# Patient Record
Sex: Female | Born: 1950 | Race: White | Hispanic: No | Marital: Married | Smoking: Never smoker
Health system: Southern US, Community
[De-identification: ages and names within clinical notes are randomized; demographics above are authoritative.]

## PROBLEM LIST (undated history)

## (undated) DIAGNOSIS — D369 Benign neoplasm, unspecified site: Secondary | ICD-10-CM

## (undated) DIAGNOSIS — H269 Unspecified cataract: Secondary | ICD-10-CM

## (undated) DIAGNOSIS — E785 Hyperlipidemia, unspecified: Secondary | ICD-10-CM

## (undated) DIAGNOSIS — K589 Irritable bowel syndrome without diarrhea: Secondary | ICD-10-CM

## (undated) DIAGNOSIS — T7840XA Allergy, unspecified, initial encounter: Secondary | ICD-10-CM

## (undated) DIAGNOSIS — F419 Anxiety disorder, unspecified: Secondary | ICD-10-CM

## (undated) DIAGNOSIS — R011 Cardiac murmur, unspecified: Secondary | ICD-10-CM

## (undated) DIAGNOSIS — K602 Anal fissure, unspecified: Principal | ICD-10-CM

## (undated) HISTORY — PX: BREAST BIOPSY: SHX20

## (undated) HISTORY — DX: Anal fissure, unspecified: K60.2

## (undated) HISTORY — DX: Cardiac murmur, unspecified: R01.1

## (undated) HISTORY — DX: Irritable bowel syndrome, unspecified: K58.9

## (undated) HISTORY — DX: Allergy, unspecified, initial encounter: T78.40XA

## (undated) HISTORY — DX: Unspecified cataract: H26.9

## (undated) HISTORY — PX: TUBAL LIGATION: SHX77

## (undated) HISTORY — PX: COLON SURGERY: SHX602

## (undated) HISTORY — DX: Anxiety disorder, unspecified: F41.9

## (undated) HISTORY — PX: COLONOSCOPY: SHX174

## (undated) HISTORY — DX: Hyperlipidemia, unspecified: E78.5

## (undated) HISTORY — DX: Benign neoplasm, unspecified site: D36.9

## (undated) HISTORY — PX: BREAST CYST ASPIRATION: SHX578

---

## 1998-09-10 ENCOUNTER — Other Ambulatory Visit: Admission: RE | Admit: 1998-09-10 | Discharge: 1998-09-10 | Payer: Self-pay | Admitting: Obstetrics & Gynecology

## 1999-10-21 ENCOUNTER — Other Ambulatory Visit: Admission: RE | Admit: 1999-10-21 | Discharge: 1999-10-21 | Payer: Self-pay | Admitting: Obstetrics & Gynecology

## 1999-10-27 ENCOUNTER — Other Ambulatory Visit: Admission: RE | Admit: 1999-10-27 | Discharge: 1999-10-27 | Payer: Self-pay | Admitting: Otolaryngology

## 1999-10-27 ENCOUNTER — Encounter (INDEPENDENT_AMBULATORY_CARE_PROVIDER_SITE_OTHER): Payer: Self-pay

## 2000-12-10 ENCOUNTER — Other Ambulatory Visit: Admission: RE | Admit: 2000-12-10 | Discharge: 2000-12-10 | Payer: Self-pay | Admitting: Obstetrics & Gynecology

## 2002-01-19 HISTORY — PX: BREAST BIOPSY: SHX20

## 2002-01-24 ENCOUNTER — Other Ambulatory Visit: Admission: RE | Admit: 2002-01-24 | Discharge: 2002-01-24 | Payer: Self-pay | Admitting: Obstetrics & Gynecology

## 2002-01-26 ENCOUNTER — Encounter: Payer: Self-pay | Admitting: Obstetrics & Gynecology

## 2002-01-26 ENCOUNTER — Encounter (INDEPENDENT_AMBULATORY_CARE_PROVIDER_SITE_OTHER): Payer: Self-pay | Admitting: Specialist

## 2002-01-26 ENCOUNTER — Encounter: Admission: RE | Admit: 2002-01-26 | Discharge: 2002-01-26 | Payer: Self-pay | Admitting: Obstetrics & Gynecology

## 2003-03-06 ENCOUNTER — Other Ambulatory Visit: Admission: RE | Admit: 2003-03-06 | Discharge: 2003-03-06 | Payer: Self-pay | Admitting: Obstetrics & Gynecology

## 2003-12-18 ENCOUNTER — Ambulatory Visit: Payer: Self-pay | Admitting: Internal Medicine

## 2003-12-27 ENCOUNTER — Ambulatory Visit: Payer: Self-pay | Admitting: Internal Medicine

## 2004-03-21 ENCOUNTER — Ambulatory Visit: Payer: Self-pay | Admitting: Internal Medicine

## 2004-04-09 ENCOUNTER — Ambulatory Visit: Payer: Self-pay | Admitting: Internal Medicine

## 2004-04-10 ENCOUNTER — Other Ambulatory Visit: Admission: RE | Admit: 2004-04-10 | Discharge: 2004-04-10 | Payer: Self-pay | Admitting: Obstetrics & Gynecology

## 2005-08-21 ENCOUNTER — Ambulatory Visit: Payer: Self-pay | Admitting: Internal Medicine

## 2005-09-01 ENCOUNTER — Ambulatory Visit: Payer: Self-pay | Admitting: Internal Medicine

## 2005-12-22 ENCOUNTER — Ambulatory Visit: Payer: Self-pay | Admitting: Internal Medicine

## 2005-12-22 LAB — CONVERTED CEMR LAB
ALT: 23 units/L (ref 0–40)
AST: 27 units/L (ref 0–37)
Albumin: 3.9 g/dL (ref 3.5–5.2)
Alkaline Phosphatase: 64 units/L (ref 39–117)
BUN: 10 mg/dL (ref 6–23)
Basophils Absolute: 0 10*3/uL (ref 0.0–0.1)
Basophils Relative: 0.5 % (ref 0.0–1.0)
CO2: 29 meq/L (ref 19–32)
Calcium: 9.1 mg/dL (ref 8.4–10.5)
Chloride: 105 meq/L (ref 96–112)
Chol/HDL Ratio, serum: 3.4
Cholesterol: 220 mg/dL (ref 0–200)
Creatinine, Ser: 0.9 mg/dL (ref 0.4–1.2)
Eosinophil percent: 2.6 % (ref 0.0–5.0)
GFR calc non Af Amer: 69 mL/min
Glomerular Filtration Rate, Af Am: 83 mL/min/{1.73_m2}
Glucose, Bld: 86 mg/dL (ref 70–99)
HCT: 36.6 % (ref 36.0–46.0)
HDL: 64.2 mg/dL (ref >39.0)
Hemoglobin: 12.7 g/dL (ref 12.0–15.0)
LDL DIRECT: 141.6 mg/dL
Lymphocytes Relative: 37.1 % (ref 12.0–46.0)
MCHC: 34.7 g/dL (ref 30.0–36.0)
MCV: 88.7 fL (ref 78.0–100.0)
Monocytes Absolute: 0.6 10*3/uL (ref 0.2–0.7)
Monocytes Relative: 16.9 % — ABNORMAL HIGH (ref 3.0–11.0)
Neutro Abs: 1.5 10*3/uL (ref 1.4–7.7)
Neutrophils Relative %: 42.9 % — ABNORMAL LOW (ref 43.0–77.0)
Platelets: 232 10*3/uL (ref 150–400)
Potassium: 3.8 meq/L (ref 3.5–5.1)
RBC: 4.13 M/uL (ref 3.87–5.11)
RDW: 12.5 % (ref 11.5–14.6)
Sodium: 142 meq/L (ref 135–145)
TSH: 1.66 microintl units/mL (ref 0.35–5.50)
Total Bilirubin: 0.8 mg/dL (ref 0.3–1.2)
Total Protein: 6.9 g/dL (ref 6.0–8.3)
Triglyceride fasting, serum: 77 mg/dL (ref 0–149)
VLDL: 15 mg/dL (ref 0–40)
WBC: 3.5 10*3/uL — ABNORMAL LOW (ref 4.5–10.5)

## 2005-12-29 ENCOUNTER — Ambulatory Visit: Payer: Self-pay | Admitting: Internal Medicine

## 2006-03-30 ENCOUNTER — Ambulatory Visit: Payer: Self-pay | Admitting: Internal Medicine

## 2006-03-30 LAB — CONVERTED CEMR LAB
Cholesterol: 211 mg/dL (ref 0–200)
Direct LDL: 121.3 mg/dL
HDL: 62 mg/dL (ref >39.0)
Total CHOL/HDL Ratio: 3.4
Triglycerides: 87 mg/dL (ref 0–149)
VLDL: 17 mg/dL (ref 0–40)

## 2006-04-06 ENCOUNTER — Ambulatory Visit: Payer: Self-pay | Admitting: Internal Medicine

## 2006-10-25 DIAGNOSIS — J309 Allergic rhinitis, unspecified: Secondary | ICD-10-CM | POA: Insufficient documentation

## 2007-03-22 ENCOUNTER — Telehealth: Payer: Self-pay | Admitting: *Deleted

## 2007-04-08 ENCOUNTER — Ambulatory Visit: Payer: Self-pay | Admitting: Internal Medicine

## 2007-04-08 LAB — CONVERTED CEMR LAB
ALT: 39 units/L — ABNORMAL HIGH (ref 0–35)
AST: 47 units/L — ABNORMAL HIGH (ref 0–37)
Albumin: 4 g/dL (ref 3.5–5.2)
Alkaline Phosphatase: 55 units/L (ref 39–117)
BUN: 8 mg/dL (ref 6–23)
Basophils Absolute: 0 10*3/uL (ref 0.0–0.1)
Basophils Relative: 0.5 % (ref 0.0–1.0)
Bilirubin Urine: NEGATIVE
Bilirubin, Direct: 0.2 mg/dL (ref 0.0–0.3)
CO2: 29 meq/L (ref 19–32)
Calcium: 9.7 mg/dL (ref 8.4–10.5)
Chloride: 105 meq/L (ref 96–112)
Cholesterol: 211 mg/dL (ref 0–200)
Creatinine, Ser: 0.9 mg/dL (ref 0.4–1.2)
Direct LDL: 118.6 mg/dL
Eosinophils Absolute: 0.1 10*3/uL (ref 0.0–0.6)
Eosinophils Relative: 2 % (ref 0.0–5.0)
GFR calc Af Amer: 83 mL/min
GFR calc non Af Amer: 69 mL/min
Glucose, Bld: 81 mg/dL (ref 70–99)
Glucose, Urine, Semiquant: NEGATIVE
HCT: 37.1 % (ref 36.0–46.0)
HDL: 69.9 mg/dL (ref >39.0)
Hemoglobin: 12.3 g/dL (ref 12.0–15.0)
Ketones, urine, test strip: NEGATIVE
Lymphocytes Relative: 21.1 % (ref 12.0–46.0)
MCHC: 33.3 g/dL (ref 30.0–36.0)
MCV: 91 fL (ref 78.0–100.0)
Monocytes Absolute: 0.7 10*3/uL (ref 0.2–0.7)
Monocytes Relative: 14.3 % — ABNORMAL HIGH (ref 3.0–11.0)
Neutro Abs: 3.1 10*3/uL (ref 1.4–7.7)
Neutrophils Relative %: 62.1 % (ref 43.0–77.0)
Nitrite: NEGATIVE
Platelets: 206 10*3/uL (ref 150–400)
Potassium: 4 meq/L (ref 3.5–5.1)
Protein, U semiquant: NEGATIVE
RBC: 4.07 M/uL (ref 3.87–5.11)
RDW: 12.9 % (ref 11.5–14.6)
Sodium: 140 meq/L (ref 135–145)
Specific Gravity, Urine: 1.015
TSH: 1.26 microintl units/mL (ref 0.35–5.50)
Total Bilirubin: 0.8 mg/dL (ref 0.3–1.2)
Total CHOL/HDL Ratio: 3
Total Protein: 6.7 g/dL (ref 6.0–8.3)
Triglycerides: 102 mg/dL (ref 0–149)
Urobilinogen, UA: 0.2
VLDL: 20 mg/dL (ref 0–40)
WBC Urine, dipstick: NEGATIVE
WBC: 5 10*3/uL (ref 4.5–10.5)
pH: 7.5

## 2007-04-15 ENCOUNTER — Ambulatory Visit: Payer: Self-pay | Admitting: Internal Medicine

## 2007-04-15 DIAGNOSIS — J209 Acute bronchitis, unspecified: Secondary | ICD-10-CM | POA: Insufficient documentation

## 2007-06-03 ENCOUNTER — Telehealth: Payer: Self-pay | Admitting: *Deleted

## 2007-06-03 ENCOUNTER — Ambulatory Visit: Payer: Self-pay | Admitting: Internal Medicine

## 2007-06-03 LAB — CONVERTED CEMR LAB
ALT: 20 units/L (ref 0–35)
AST: 26 units/L (ref 0–37)
Albumin: 3.9 g/dL (ref 3.5–5.2)
Alkaline Phosphatase: 61 units/L (ref 39–117)
Bilirubin, Direct: 0.1 mg/dL (ref 0.0–0.3)
Total Bilirubin: 0.7 mg/dL (ref 0.3–1.2)
Total Protein: 7 g/dL (ref 6.0–8.3)

## 2007-06-10 ENCOUNTER — Ambulatory Visit: Payer: Self-pay | Admitting: Internal Medicine

## 2007-08-12 ENCOUNTER — Ambulatory Visit: Payer: Self-pay | Admitting: Internal Medicine

## 2007-08-19 LAB — CONVERTED CEMR LAB: Pap Smear: NORMAL

## 2007-08-26 ENCOUNTER — Encounter: Payer: Self-pay | Admitting: Internal Medicine

## 2007-08-26 ENCOUNTER — Ambulatory Visit: Payer: Self-pay | Admitting: Internal Medicine

## 2007-08-26 DIAGNOSIS — D369 Benign neoplasm, unspecified site: Secondary | ICD-10-CM

## 2007-08-26 HISTORY — DX: Benign neoplasm, unspecified site: D36.9

## 2007-09-09 ENCOUNTER — Encounter: Payer: Self-pay | Admitting: Internal Medicine

## 2007-12-02 ENCOUNTER — Ambulatory Visit: Payer: Self-pay | Admitting: Internal Medicine

## 2007-12-02 LAB — CONVERTED CEMR LAB
ALT: 27 units/L (ref 0–35)
AST: 35 units/L (ref 0–37)
Albumin: 3.8 g/dL (ref 3.5–5.2)
Alkaline Phosphatase: 53 units/L (ref 39–117)
Bilirubin, Direct: 0.1 mg/dL (ref 0.0–0.3)
Cholesterol: 205 mg/dL (ref 0–200)
Direct LDL: 107.1 mg/dL
HDL: 66.6 mg/dL (ref >39.0)
Total Bilirubin: 0.6 mg/dL (ref 0.3–1.2)
Total CHOL/HDL Ratio: 3.1
Total Protein: 6.6 g/dL (ref 6.0–8.3)
Triglycerides: 74 mg/dL (ref 0–149)
VLDL: 15 mg/dL (ref 0–40)

## 2007-12-09 ENCOUNTER — Ambulatory Visit: Payer: Self-pay | Admitting: Internal Medicine

## 2007-12-09 DIAGNOSIS — E785 Hyperlipidemia, unspecified: Secondary | ICD-10-CM | POA: Insufficient documentation

## 2008-05-31 ENCOUNTER — Ambulatory Visit: Payer: Self-pay | Admitting: Internal Medicine

## 2008-05-31 LAB — CONVERTED CEMR LAB
ALT: 19 units/L (ref 0–35)
AST: 31 units/L (ref 0–37)
Albumin: 3.8 g/dL (ref 3.5–5.2)
Alkaline Phosphatase: 42 units/L (ref 39–117)
Bilirubin, Direct: 0.1 mg/dL (ref 0.0–0.3)
Cholesterol: 225 mg/dL — ABNORMAL HIGH (ref 0–200)
Direct LDL: 139 mg/dL
HDL: 75.5 mg/dL (ref >39.00)
Total Bilirubin: 0.8 mg/dL (ref 0.3–1.2)
Total CHOL/HDL Ratio: 3
Total Protein: 7 g/dL (ref 6.0–8.3)
Triglycerides: 60 mg/dL (ref 0.0–149.0)
VLDL: 12 mg/dL (ref 0.0–40.0)

## 2008-06-07 ENCOUNTER — Ambulatory Visit: Payer: Self-pay | Admitting: Internal Medicine

## 2008-06-07 LAB — CONVERTED CEMR LAB
Basophils Relative: 0.2 % (ref 0.0–3.0)
CO2: 27 meq/L (ref 19–32)
Chloride: 105 meq/L (ref 96–112)
Creatinine, Ser: 0.9 mg/dL (ref 0.4–1.2)
Eosinophils Absolute: 0.1 10*3/uL (ref 0.0–0.7)
Eosinophils Relative: 1.7 % (ref 0.0–5.0)
HCT: 39.1 % (ref 36.0–46.0)
Hemoglobin: 13.4 g/dL (ref 12.0–15.0)
Lymphs Abs: 1.2 10*3/uL (ref 0.7–4.0)
MCHC: 34.2 g/dL (ref 30.0–36.0)
MCV: 89.7 fL (ref 78.0–100.0)
Monocytes Absolute: 0.4 10*3/uL (ref 0.1–1.0)
Neutro Abs: 1.6 10*3/uL (ref 1.4–7.7)
Potassium: 3.7 meq/L (ref 3.5–5.1)
RBC: 4.35 M/uL (ref 3.87–5.11)
Sodium: 138 meq/L (ref 135–145)
WBC: 3.3 10*3/uL — ABNORMAL LOW (ref 4.5–10.5)

## 2008-07-18 LAB — CONVERTED CEMR LAB: Pap Smear: NORMAL

## 2008-08-23 ENCOUNTER — Ambulatory Visit: Payer: Self-pay | Admitting: Family Medicine

## 2008-12-04 ENCOUNTER — Telehealth: Payer: Self-pay | Admitting: Internal Medicine

## 2008-12-04 ENCOUNTER — Encounter: Payer: Self-pay | Admitting: Internal Medicine

## 2008-12-04 ENCOUNTER — Ambulatory Visit: Payer: Self-pay | Admitting: Internal Medicine

## 2008-12-04 LAB — CONVERTED CEMR LAB
AST: 31 units/L (ref 0–37)
Alkaline Phosphatase: 48 units/L (ref 39–117)
Total Bilirubin: 0.8 mg/dL (ref 0.3–1.2)
Total CHOL/HDL Ratio: 3
VLDL: 12.6 mg/dL (ref 0.0–40.0)

## 2008-12-11 ENCOUNTER — Ambulatory Visit: Payer: Self-pay | Admitting: Internal Medicine

## 2009-06-11 ENCOUNTER — Ambulatory Visit: Payer: Self-pay | Admitting: Internal Medicine

## 2009-06-11 LAB — CONVERTED CEMR LAB
AST: 27 units/L (ref 0–37)
Albumin: 4.2 g/dL (ref 3.5–5.2)
Alkaline Phosphatase: 49 units/L (ref 39–117)
Basophils Absolute: 0 10*3/uL (ref 0.0–0.1)
Bilirubin Urine: NEGATIVE
Bilirubin, Direct: 0.1 mg/dL (ref 0.0–0.3)
CO2: 31 meq/L (ref 19–32)
Calcium: 9.3 mg/dL (ref 8.4–10.5)
Cholesterol: 235 mg/dL — ABNORMAL HIGH (ref 0–200)
Creatinine, Ser: 0.8 mg/dL (ref 0.4–1.2)
Eosinophils Absolute: 0.1 10*3/uL (ref 0.0–0.7)
GFR calc non Af Amer: 77.9 mL/min (ref >60)
Glucose, Bld: 83 mg/dL (ref 70–99)
HDL: 82.1 mg/dL (ref >39.00)
Hemoglobin: 12.5 g/dL (ref 12.0–15.0)
Ketones, urine, test strip: NEGATIVE
Lymphocytes Relative: 33.8 % (ref 12.0–46.0)
MCHC: 34.6 g/dL (ref 30.0–36.0)
Monocytes Relative: 11.4 % (ref 3.0–12.0)
Neutrophils Relative %: 52.6 % (ref 43.0–77.0)
Nitrite: NEGATIVE
Platelets: 218 10*3/uL (ref 150.0–400.0)
RDW: 13.7 % (ref 11.5–14.6)
Sodium: 142 meq/L (ref 135–145)
Specific Gravity, Urine: 1.015
Total Bilirubin: 0.7 mg/dL (ref 0.3–1.2)
Triglycerides: 57 mg/dL (ref 0.0–149.0)
Urobilinogen, UA: 0.2

## 2009-06-24 ENCOUNTER — Ambulatory Visit: Payer: Self-pay | Admitting: Internal Medicine

## 2009-07-18 LAB — HM PAP SMEAR

## 2009-07-18 LAB — HM MAMMOGRAPHY

## 2009-12-18 ENCOUNTER — Ambulatory Visit: Payer: Self-pay | Admitting: Internal Medicine

## 2009-12-18 LAB — CONVERTED CEMR LAB
Albumin: 4.1 g/dL (ref 3.5–5.2)
Alkaline Phosphatase: 54 units/L (ref 39–117)
Cholesterol: 261 mg/dL — ABNORMAL HIGH (ref 0–200)
Direct LDL: 162.7 mg/dL
HDL: 76.8 mg/dL (ref >39.00)
Total CHOL/HDL Ratio: 3
Total Protein: 7 g/dL (ref 6.0–8.3)
Triglycerides: 65 mg/dL (ref 0.0–149.0)
VLDL: 13 mg/dL (ref 0.0–40.0)

## 2009-12-25 ENCOUNTER — Ambulatory Visit: Payer: Self-pay | Admitting: Internal Medicine

## 2009-12-25 LAB — CONVERTED CEMR LAB: HDL goal, serum: 40 mg/dL

## 2009-12-26 ENCOUNTER — Encounter: Payer: Self-pay | Admitting: Internal Medicine

## 2010-02-17 NOTE — Assessment & Plan Note (Addendum)
Summary: cpx---no pap//ccm   Vital Signs:  Patient profile:   60 year old female Height:      65 inches Weight:      152 pounds BMI:     25.39 Temp:     98.2 degrees F oral Pulse rate:   60 / minute Resp:     12 per minute BP sitting:   110 / 80  (left arm)  Vitals Entered By: Willy Eddy, LPN (June 25, 2839 8:45 AM) CC: cpx-   CC:  cpx-.  Preventive Screening-Counseling & Management  Alcohol-Tobacco     Smoking Status: never  Problems Prior to Update: 1)  Hyperlipidemia, Borderline  (ICD-272.4) 2)  Acute Bronchitis  (ICD-466.0) 3)  Physical Examination  (ICD-V70.0) 4)  Family History Diabetes 1st Degree Relative  (ICD-V18.0) 5)  Allergic Rhinitis  (ICD-477.9)  Current Problems (verified): 1)  Hyperlipidemia, Borderline  (ICD-272.4) 2)  Acute Bronchitis  (ICD-466.0) 3)  Physical Examination  (ICD-V70.0) 4)  Family History Diabetes 1st Degree Relative  (ICD-V18.0) 5)  Allergic Rhinitis  (ICD-477.9)  Medications Prior to Update: 1)  Zyrtec Allergy 10 Mg  Tabs (Cetirizine Hcl) .... One By Mouth Daily 2)  Calcium 600 + D 600-200 Mg-Unit  Tabs (Calcium Carbonate-Vitamin D) .Marland Kitchen.. 1200 -1600 Once Daily 3)  Red Yeast Rice 600 Mg  Caps (Red Yeast Rice Extract) .... 2 Two Times A Day 4)  Flax Seed Oil 1000 Mg  Caps (Flaxseed (Linseed)) .... Once Daily 5)  Nasonex 50 Mcg/act Susp (Mometasone Furoate) .... Two Spray in Each Nostril As Needed 6)  Amoxicillin 500 Mg Caps (Amoxicillin) .... One By Mouth Tid 7)  Allerx Dose Pack  Misc (Pse-Methscop & Cpm-Pe-Methscop) .... One By Mouth Two Times A Day As Directed 8)  Aspirin 81 Mg Tbec (Aspirin) .Marland Kitchen.. 1 Once Daily 9)  Vitamin D 1000 Unit Tabs (Cholecalciferol) .Marland Kitchen.. 1 Once Daily  Current Medications (verified): 1)  Zyrtec Allergy 10 Mg  Tabs (Cetirizine Hcl) .... One By Mouth Daily 2)  Calcium 600 + D 600-200 Mg-Unit  Tabs (Calcium Carbonate-Vitamin D) .Marland Kitchen.. 1200 -1600 Once Daily 3)  Red Yeast Rice 600 Mg  Caps (Red Yeast Rice  Extract) .... 2  Once Daily 4)  Flax Seed Oil 1000 Mg  Caps (Flaxseed (Linseed)) .... Once Daily 5)  Nasonex 50 Mcg/act Susp (Mometasone Furoate) .... Two Spray in Each Nostril As Needed 6)  Aspirin 81 Mg Tbec (Aspirin) .Marland Kitchen.. 1 Once Daily 7)  Vitamin D 1000 Unit Tabs (Cholecalciferol) .Marland Kitchen.. 1 Once Daily  Allergies (verified): No Known Drug Allergies  Past History:  Family History: Last updated: 10/25/2006 Family History Diabetes 1st degree relative Family History Hypertension Family History of Cardiovascular disorder Family History of Leukemia  Social History: Last updated: 10/25/2006 Occupation: Married Never Smoked Alcohol use-no  Risk Factors: Smoking Status: never (06/24/2009)  Past medical, surgical, family and social histories (including risk factors) reviewed, and no changes noted (except as noted below).  Past Medical History: Reviewed history from 10/25/2006 and no changes required. Allergies Carpal Tunnel Allergic rhinitis  Past Surgical History: Reviewed history from 10/25/2006 and no changes required. Tubal ligation Breast Bx  Family History: Reviewed history from 10/25/2006 and no changes required. Family History Diabetes 1st degree relative Family History Hypertension Family History of Cardiovascular disorder Family History of Leukemia  Social History: Reviewed history from 10/25/2006 and no changes required. Occupation: Married Never Smoked Alcohol use-no  Review of Systems  The patient denies anorexia, fever, weight loss, weight  gain, vision loss, decreased hearing, hoarseness, chest pain, syncope, dyspnea on exertion, peripheral edema, prolonged cough, headaches, hemoptysis, abdominal pain, melena, hematochezia, severe indigestion/heartburn, hematuria, incontinence, genital sores, muscle weakness, suspicious skin lesions, transient blindness, difficulty walking, depression, unusual weight change, abnormal bleeding, enlarged lymph nodes,  angioedema, and breast masses.    Physical Exam  General:  Well-developed,well-nourished,in no acute distress; alert,appropriate and cooperative throughout examination Head:  Normocephalic and atraumatic without obvious abnormalities. No apparent alopecia or balding. Eyes:  No corneal or conjunctival inflammation noted. EOMI. Perrla. Funduscopic exam benign, without hemorrhages, exudates or papilledema. Vision grossly normal. Ears:  R ear normal and L ear normal.   Nose:  no purulent discharge noted Mouth:  pharyngeal erythema and postnasal drip.   Neck:  No deformities, masses, or tenderness noted. Lungs:  Normal respiratory effort, chest expands symmetrically. Lungs are clear to auscultation, no crackles or wheezes. Heart:  regular rhythm and rate Abdomen:  soft, non-tender, and normal bowel sounds.   Pulses:  R and L carotid,radial,femoral,dorsalis pedis and posterior tibial pulses are full and equal bilaterally Extremities:  No clubbing, cyanosis, edema, or deformity noted with normal full range of motion of all joints.   Neurologic:  No cranial nerve deficits noted. Station and gait are normal. Plantar reflexes are down-going bilaterally. DTRs are symmetrical throughout. Sensory, motor and coordinative functions appear intact.   Impression & Recommendations:  Problem # 1:  PHYSICAL EXAMINATION (ICD-V70.0) .cpx Mammogram: normal (07/18/2008) Pap smear: normal (07/18/2008) Colonoscopy: Location:  [facility].   (08/26/2007) Td Booster: Historical (01/19/2005)   Flu Vax: Historical (10/19/2008)   Chol: 235 (06/11/2009)   HDL: 82.10 (06/11/2009)   LDL: DEL (12/02/2007)   TG: 57.0 (06/11/2009) TSH: 2.66 (06/11/2009)   Next mammogram due:: 07/2009 (06/24/2009) Next Colonoscopy due:: 08/2012 (08/26/2007)  Discussed using sunscreen, use of alcohol, drug use, self breast exam, routine dental care, routine eye care, schedule for GYN exam, routine physical exam, seat belts,  multiple vitamins, osteoporosis prevention, adequate calcium intake in diet, recommendations for immunizations, mammograms and Pap smears.  Discussed exercise and checking cholesterol.  Discussed gun safety, safe sex, and contraception.  Problem # 2:  ALLERGIC RHINITIS (ICD-477.9) Assessment: Unchanged  Her updated medication list for this problem includes:    Zyrtec Allergy 10 Mg Tabs (Cetirizine hcl) ..... One by mouth daily    Nasonex 50 Mcg/act Susp (Mometasone furoate) .Marland Kitchen..Marland Kitchen Two spray in each nostril as needed  Discussed use of allergy medications and environmental measures.   Complete Medication List: 1)  Zyrtec Allergy 10 Mg Tabs (Cetirizine hcl) .... One by mouth daily 2)  Calcium 600 + D 600-200 Mg-unit Tabs (Calcium carbonate-vitamin d) .Marland Kitchen.. 1200 -1600 once daily 3)  Red Yeast Rice 600 Mg Caps (Red yeast rice extract) .... 2  once daily 4)  Flax Seed Oil 1000 Mg Caps (Flaxseed (linseed)) .... Once daily 5)  Nasonex 50 Mcg/act Susp (Mometasone furoate) .... Two spray in each nostril as needed 6)  Aspirin 81 Mg Tbec (Aspirin) .Marland Kitchen.. 1 once daily 7)  Vitamin D 1000 Unit Tabs (Cholecalciferol) .Marland Kitchen.. 1 once daily 8)  Allerx-d 120-2.5 Mg Xr12h-tab (Pseudoephedrine-methscopolamin) .... One by mouth two times a day for 7 days  Patient Instructions: 1)  Please schedule a follow-up appointment in 6 months. 2)  Hepatic Panel prior to visit, ICD-9:995.20 272.2 3)  Lipid Panel prior to visit, ICD-9: Prescriptions: ZYRTEC ALLERGY 10 MG  TABS (CETIRIZINE HCL) one by mouth daily  #90 x 3   Entered by:  Willy Eddy, LPN   Authorized by:   Stacie Glaze MD   Signed by:   Willy Eddy, LPN on 16/10/9602   Method used:   Electronically to        [facility]* (mail-order)             ,          Ph: [phone]       Fax: [phone]   RxID:   [id number]    Preventive Care Screening  Mammogram:    Date:  07/18/2008    Next Due:  07/2009    Results:  normal   Pap  Smear:    Date:  07/18/2008    Next Due:  07/2009    Results:  normal     Immunization History:  Influenza Immunization History:    Influenza:  historical (10/19/2008)

## 2010-02-17 NOTE — Letter (Addendum)
Summary: Aeronautical engineer   Imported By: Georgian Co 12/26/2009 09:26:09  _____________________________________________________________________  External Attachment:    Type:   Image     Comment:   External Document

## 2010-02-20 NOTE — Assessment & Plan Note (Signed)
Summary: 6 month fup//ccm   Vital Signs:  Patient profile:   60 year old female Height:      65 inches Weight:      154 pounds BMI:     25.72 Temp:     98.2 degrees F oral Pulse rate:   64 / minute Resp:     14 per minute BP sitting:   130 / 80  (left arm)  Vitals Entered By: Willy Eddy, LPN (December 25, 2009 9:23 AM) CC: roa, Lipid Management Is Patient Diabetic? No   Primary Care Starr Engel:  Stacie Glaze MD  CC:  roa and Lipid Management.  History of Present Illness: follow up for lipids  Follow-Up Visit      This is a 60 year old woman who presents for Follow-up visit.  The patient denies chest pain, palpitations, dizziness, syncope, low blood sugar symptoms, high blood sugar symptoms, edema, SOB, DOE, PND, and orthopnea.  Since the last visit the patient notes no new problems or concerns.  The patient reports taking meds as prescribed.  When questioned about possible medication side effects, the patient notes cramping.    Lipid Management History:      Positive NCEP/ATP III risk factors include female age 61 years old or older.  Negative NCEP/ATP III risk factors include HDL cholesterol greater than 60, non-tobacco-user status, non-hypertensive, no ASHD (atherosclerotic heart disease), no prior stroke/TIA, no peripheral vascular disease, and no history of aortic aneurysm.     Preventive Screening-Counseling & Management  Alcohol-Tobacco     Smoking Status: never     Tobacco Counseling: not indicated; no tobacco use  Problems Prior to Update: 1)  Hyperlipidemia, Borderline  (ICD-272.4) 2)  Acute Bronchitis  (ICD-466.0) 3)  Physical Examination  (ICD-V70.0) 4)  Family History Diabetes 1st Degree Relative  (ICD-V18.0) 5)  Allergic Rhinitis  (ICD-477.9)  Current Problems (verified): 1)  Hyperlipidemia, Borderline  (ICD-272.4) 2)  Acute Bronchitis  (ICD-466.0) 3)  Physical Examination  (ICD-V70.0) 4)  Family History Diabetes 1st Degree Relative   (ICD-V18.0) 5)  Allergic Rhinitis  (ICD-477.9)  Medications Prior to Update: 1)  Zyrtec Allergy 10 Mg  Tabs (Cetirizine Hcl) .... One By Mouth Daily 2)  Calcium 600 + D 600-200 Mg-Unit  Tabs (Calcium Carbonate-Vitamin D) .Marland Kitchen.. 1200 -1600 Once Daily 3)  Red Yeast Rice 600 Mg  Caps (Red Yeast Rice Extract) .... 2  Once Daily 4)  Flax Seed Oil 1000 Mg  Caps (Flaxseed (Linseed)) .... Once Daily 5)  Nasonex 50 Mcg/act Susp (Mometasone Furoate) .... Two Spray in Each Nostril As Needed 6)  Aspirin 81 Mg Tbec (Aspirin) .Marland Kitchen.. 1 Once Daily 7)  Vitamin D 1000 Unit Tabs (Cholecalciferol) .Marland Kitchen.. 1 Once Daily  Current Medications (verified): 1)  Zyrtec Allergy 10 Mg  Tabs (Cetirizine Hcl) .... One By Mouth Daily 2)  Calcium 600 + D 600-200 Mg-Unit  Tabs (Calcium Carbonate-Vitamin D) .Marland Kitchen.. 1200 -1600 Once Daily 3)  Crestor 10 Mg Tabs (Rosuvastatin Calcium) .... One By Mouth Every Friday Night 4)  Flax Seed Oil 1000 Mg  Caps (Flaxseed (Linseed)) .... Once Daily 5)  Nasonex 50 Mcg/act Susp (Mometasone Furoate) .... Two Spray in Each Nostril As Needed 6)  Aspirin 81 Mg Tbec (Aspirin) .Marland Kitchen.. 1 Once Daily 7)  Vitamin D 1000 Unit Tabs (Cholecalciferol) .Marland Kitchen.. 1 Once Daily  Allergies (verified): No Known Drug Allergies  Past History:  Family History: Last updated: 10/25/2006 Family History Diabetes 1st degree relative Family History Hypertension Family  History of Cardiovascular disorder Family History of Leukemia  Social History: Last updated: 10/25/2006 Occupation: Married Never Smoked Alcohol use-no  Risk Factors: Smoking Status: never (12/25/2009)  Past medical, surgical, family and social histories (including risk factors) reviewed, and no changes noted (except as noted below).  Past Medical History: Reviewed history from 10/25/2006 and no changes required. Allergies Carpal Tunnel Allergic rhinitis  Past Surgical History: Reviewed history from 10/25/2006 and no changes required. Tubal  ligation Breast Bx  Family History: Reviewed history from 10/25/2006 and no changes required. Family History Diabetes 1st degree relative Family History Hypertension Family History of Cardiovascular disorder Family History of Leukemia  Social History: Reviewed history from 10/25/2006 and no changes required. Occupation: Married Never Smoked Alcohol use-no  Review of Systems  The patient denies anorexia, fever, weight loss, weight gain, vision loss, decreased hearing, hoarseness, chest pain, syncope, dyspnea on exertion, peripheral edema, prolonged cough, headaches, hemoptysis, abdominal pain, melena, hematochezia, severe indigestion/heartburn, hematuria, incontinence, genital sores, muscle weakness, suspicious skin lesions, transient blindness, difficulty walking, depression, unusual weight change, abnormal bleeding, enlarged lymph nodes, angioedema, and breast masses.    Physical Exam  General:  Well-developed,well-nourished,in no acute distress; alert,appropriate and cooperative throughout examination Head:  Normocephalic and atraumatic without obvious abnormalities. No apparent alopecia or balding. Eyes:  No corneal or conjunctival inflammation noted. EOMI. Perrla. Funduscopic exam benign, without hemorrhages, exudates or papilledema. Vision grossly normal. Ears:  R ear normal and L ear normal.   Neck:  No deformities, masses, or tenderness noted. Lungs:  Normal respiratory effort, chest expands symmetrically. Lungs are clear to auscultation, no crackles or wheezes. Heart:  regular rhythm and rate Abdomen:  soft, non-tender, and normal bowel sounds.   Neurologic:  No cranial nerve deficits noted. Station and gait are normal. Plantar reflexes are down-going bilaterally. DTRs are symmetrical throughout. Sensory, motor and coordinative functions appear intact.   Impression & Recommendations:  Problem # 1:  HYPERLIPIDEMIA, BORDERLINE (ICD-272.4) Assessment Improved  Labs  Reviewed: SGOT: 29 (12/18/2009)   SGPT: 21 (12/18/2009)  Lipid Goals: Chol Goal: 200 (12/25/2009)   HDL Goal: 40 (12/25/2009)   LDL Goal: 160 (12/25/2009)   TG Goal: 150 (12/25/2009)  10 Yr Risk Heart Disease: Not enough information   HDL:76.80 (12/18/2009), 82.10 (06/11/2009)  LDL:DEL (12/02/2007), DEL (04/08/2007)  Chol:261 (12/18/2009), 235 (06/11/2009)  Trig:65.0 (12/18/2009), 57.0 (06/11/2009)  Her updated medication list for this problem includes:    Crestor 10 Mg Tabs (Rosuvastatin calcium) ..... One by mouth every friday night  Problem # 2:  ALLERGIC RHINITIS (ICD-477.9) Assessment: Deteriorated  Her updated medication list for this problem includes:    Zyrtec Allergy 10 Mg Tabs (Cetirizine hcl) ..... One by mouth daily    Nasonex 50 Mcg/act Susp (Mometasone furoate) .Marland Kitchen..Marland Kitchen Two spray in each nostril as needed  Discussed use of allergy medications and environmental measures.   Complete Medication List: 1)  Zyrtec Allergy 10 Mg Tabs (Cetirizine hcl) .... One by mouth daily 2)  Calcium 600 + D 600-200 Mg-unit Tabs (Calcium carbonate-vitamin d) .Marland Kitchen.. 1200 -1600 once daily 3)  Crestor 10 Mg Tabs (Rosuvastatin calcium) .... One by mouth every friday night 4)  Flax Seed Oil 1000 Mg Caps (Flaxseed (linseed)) .... Once daily 5)  Nasonex 50 Mcg/act Susp (Mometasone furoate) .... Two spray in each nostril as needed 6)  Aspirin 81 Mg Tbec (Aspirin) .Marland Kitchen.. 1 once daily 7)  Vitamin D 1000 Unit Tabs (Cholecalciferol) .Marland Kitchen.. 1 once daily  Other Orders: Zoster (Shingles) Vaccine Live [phone]) Admin  1st Vaccine (16109)  Lipid Assessment/Plan:      Based on NCEP/ATP III, the patient's risk factor category is "0-1 risk factors".  The patient's lipid goals are as follows: Total cholesterol goal is 200; LDL cholesterol goal is 160; HDL cholesterol goal is 40; Triglyceride goal is 150.  Her LDL cholesterol goal has not been met.  Secondary causes for hyperlipidemia have been ruled out.  She has been  counseled on adjunctive measures for lowering her cholesterol and has been provided with dietary instructions.    Patient Instructions: 1)  Please schedule a follow-up appointment in 4 months. 2)  Hepatic Panel prior to visit, ICD-9:995.20 3)  Lipid Panel prior to visit, ICD-9:272.4   Orders Added: 1)  Zoster (Shingles) Vaccine Live [90736] 2)  Admin 1st Vaccine [90471] 3)  Est. Patient Level IV [60454]   Immunization History:  Influenza Immunization History:    Influenza:  historical (10/25/2009)  Immunizations Administered:  Zostavax # 1:    Vaccine Type: Zostavax    Site: left deltoid    Mfr: Merck    Dose: 0.5 ml    Route: [place]    Given by: Willy Eddy, LPN    Exp. Date: 09/06/2010    Lot #: [id number]    VIS given: 10/31/04 given December 25, 2009.  Rotavirus (to be given today)   Immunization History:  Influenza Immunization History:    Influenza:  Historical (10/25/2009)  Immunizations Administered:  Zostavax # 1:    Vaccine Type: Zostavax    Site: left deltoid    Mfr: Merck    Dose: 0.5 ml    Route:     Given by: Willy Eddy, LPN    Exp. Date: 09/06/2010    Lot #: [id number]    VIS given: 10/31/04 given December 25, 2009.

## 2010-04-16 ENCOUNTER — Other Ambulatory Visit: Payer: Self-pay

## 2010-04-23 ENCOUNTER — Ambulatory Visit: Payer: Self-pay | Admitting: Internal Medicine

## 2010-07-02 ENCOUNTER — Other Ambulatory Visit (INDEPENDENT_AMBULATORY_CARE_PROVIDER_SITE_OTHER): Payer: 59

## 2010-07-02 ENCOUNTER — Encounter: Payer: Self-pay | Admitting: Internal Medicine

## 2010-07-02 DIAGNOSIS — Z0000 Encounter for general adult medical examination without abnormal findings: Secondary | ICD-10-CM

## 2010-07-02 LAB — POCT URINALYSIS DIPSTICK
Bilirubin, UA: NEGATIVE
Ketones, UA: NEGATIVE
Nitrite, UA: NEGATIVE
Spec Grav, UA: 1.025
pH, UA: 5

## 2010-07-02 LAB — HEPATIC FUNCTION PANEL
ALT: 17 U/L (ref 0–35)
Total Bilirubin: 0.6 mg/dL (ref 0.3–1.2)

## 2010-07-02 LAB — CBC WITH DIFFERENTIAL/PLATELET
Basophils Absolute: 0 10*3/uL (ref 0.0–0.1)
Basophils Relative: 0.5 % (ref 0.0–3.0)
Eosinophils Absolute: 0 10*3/uL (ref 0.0–0.7)
Hemoglobin: 12.2 g/dL (ref 12.0–15.0)
Lymphocytes Relative: 39.3 % (ref 12.0–46.0)
MCHC: 35.1 g/dL (ref 30.0–36.0)
MCV: 89.7 fl (ref 78.0–100.0)
Monocytes Absolute: 0.4 10*3/uL (ref 0.1–1.0)
Neutro Abs: 1.6 10*3/uL (ref 1.4–7.7)
RBC: 3.88 Mil/uL (ref 3.87–5.11)
RDW: 13.9 % (ref 11.5–14.6)

## 2010-07-02 LAB — BASIC METABOLIC PANEL
CO2: 28 mEq/L (ref 19–32)
Calcium: 9 mg/dL (ref 8.4–10.5)
Chloride: 108 mEq/L (ref 96–112)
Creatinine, Ser: 0.8 mg/dL (ref 0.4–1.2)
Glucose, Bld: 82 mg/dL (ref 70–99)
Sodium: 142 mEq/L (ref 135–145)

## 2010-07-02 LAB — LIPID PANEL
Cholesterol: 249 mg/dL — ABNORMAL HIGH (ref 0–200)
Total CHOL/HDL Ratio: 3
Triglycerides: 60 mg/dL (ref 0.0–149.0)

## 2010-07-09 ENCOUNTER — Ambulatory Visit (INDEPENDENT_AMBULATORY_CARE_PROVIDER_SITE_OTHER): Payer: 59 | Admitting: Internal Medicine

## 2010-07-09 ENCOUNTER — Encounter: Payer: Self-pay | Admitting: Internal Medicine

## 2010-07-09 DIAGNOSIS — Z0000 Encounter for general adult medical examination without abnormal findings: Secondary | ICD-10-CM

## 2010-07-09 DIAGNOSIS — E785 Hyperlipidemia, unspecified: Secondary | ICD-10-CM

## 2010-07-09 DIAGNOSIS — L570 Actinic keratosis: Secondary | ICD-10-CM

## 2010-07-09 DIAGNOSIS — Z792 Long term (current) use of antibiotics: Secondary | ICD-10-CM

## 2010-07-09 NOTE — Progress Notes (Signed)
  Subjective:    Patient ID: Angela Robbins, female    DOB: 10-12-1950, 60 y.o.   MRN: [id number]  HPI Patient presents for complete physical examination.  She has a history of hyperlipidemia and allergic rhinitis.  She was taking red rice yeast twice daily but had suspended the G-tube some muscle aches she then began coenzyme Q 10 and has resumed the red rice use but only a few days prior to this visit.  On her screening blood work her LDL cholesterol is increased  total has increased and her risk factors warned that she have an LDL cholesterol less than 409.    Review of Systems  Constitutional: Negative for activity change, appetite change and fatigue.  HENT: Negative for ear pain, congestion, neck pain, postnasal drip and sinus pressure.   Eyes: Negative for redness and visual disturbance.  Respiratory: Negative for cough, shortness of breath and wheezing.   Gastrointestinal: Negative for abdominal pain and abdominal distention.  Genitourinary: Negative for dysuria, frequency and menstrual problem.  Musculoskeletal: Negative for myalgias, joint swelling and arthralgias.  Skin: Negative for rash and wound.  Neurological: Negative for dizziness, weakness and headaches.  Hematological: Negative for adenopathy. Does not bruise/bleed easily.  Psychiatric/Behavioral: Negative for sleep disturbance and decreased concentration.       Objective:   Physical Exam  Constitutional: She is oriented to person, place, and time. She appears well-developed and well-nourished. No distress.  HENT:  Head: Normocephalic and atraumatic.  Right Ear: External ear normal.  Left Ear: External ear normal.  Nose: Nose normal.  Mouth/Throat: Oropharynx is clear and moist.  Eyes: Conjunctivae and EOM are normal. Pupils are equal, round, and reactive to light.  Neck: Normal range of motion. Neck supple. No JVD present. No tracheal deviation present. No thyromegaly present.  Cardiovascular: Normal rate,  regular rhythm, normal heart sounds and intact distal pulses.   No murmur heard. Pulmonary/Chest: Effort normal and breath sounds normal. She has no wheezes. She exhibits no tenderness.  Abdominal: Soft. Bowel sounds are normal.  Musculoskeletal: Normal range of motion. She exhibits no edema and no tenderness.  Lymphadenopathy:    She has no cervical adenopathy.  Neurological: She is alert and oriented to person, place, and time. She has normal reflexes. No cranial nerve deficit.  Skin: Skin is warm and dry. She is not diaphoretic.  Psychiatric: She has a normal mood and affect. Her behavior is normal.   On her face there is an actinic keratoses on the right cheek       Assessment & Plan:   Informed consent was obtained and the lesion was treated for 60 seconds of liquid nitrogen application the patient tolerated the procedure well as procedural care was discussed with the patient and instructions should the lesion reappears contact our office immediately   This is a routine physical examination for this healthy  Female. Reviewed all health maintenance protocols including mammography colonoscopy bone density and reviewed appropriate screening labs. Her immunization history was reviewed as well as her current medications and allergies refills of her chronic medications were given and the plan for yearly health maintenance was discussed all orders and referrals were made as appropriate.   For cholesterol she is urged to take her red rice yeast twice daily along with omega-3's thousand milligrams 4 times a day on it we will monitor her cholesterol in 3 months

## 2010-09-01 ENCOUNTER — Ambulatory Visit (INDEPENDENT_AMBULATORY_CARE_PROVIDER_SITE_OTHER): Payer: 59 | Admitting: Internal Medicine

## 2010-09-01 ENCOUNTER — Encounter: Payer: Self-pay | Admitting: Internal Medicine

## 2010-09-01 VITALS — BP 136/80 | HR 72 | Temp 98.2°F | Resp 16 | Ht 65.0 in | Wt 148.0 lb

## 2010-09-01 DIAGNOSIS — J309 Allergic rhinitis, unspecified: Secondary | ICD-10-CM

## 2010-09-01 DIAGNOSIS — J329 Chronic sinusitis, unspecified: Secondary | ICD-10-CM

## 2010-09-01 DIAGNOSIS — F419 Anxiety disorder, unspecified: Secondary | ICD-10-CM

## 2010-09-01 DIAGNOSIS — F411 Generalized anxiety disorder: Secondary | ICD-10-CM

## 2010-09-01 MED ORDER — DIAZEPAM 2 MG PO TABS: 2.0000 mg | ORAL_TABLET | Freq: Three times a day (TID) | ORAL | Status: DC | PRN

## 2010-09-01 NOTE — Progress Notes (Signed)
  Subjective:    Patient ID: Angela Robbins, female    DOB: 12-Aug-1950, 60 y.o.   MRN: [id number]  URI  Associated symptoms include coughing, ear pain and rhinorrhea. Pertinent negatives include no abdominal pain, congestion, dysuria, headaches, neck pain, rash or wheezing.  Cough Associated symptoms include ear pain, postnasal drip and rhinorrhea. Pertinent negatives include no eye redness, headaches, myalgias, rash, shortness of breath or wheezing.    URI, treated  At an urgent care she was treated with prednisone and b12 and lincosyn? After the prednisone she noted increased paranoid ideation and panic. She was given Omnicef 300 BID for 7 days. She still feels ill. Increased anxiety. No significant cough.  She has significant upper conjestion.   Review of Systems  Constitutional: Negative for activity change, appetite change and fatigue.  HENT: Positive for ear pain, rhinorrhea and postnasal drip. Negative for congestion, neck pain and sinus pressure.   Eyes: Negative for redness and visual disturbance.  Respiratory: Positive for cough. Negative for shortness of breath and wheezing.   Gastrointestinal: Negative for abdominal pain and abdominal distention.  Genitourinary: Negative for dysuria, frequency and menstrual problem.  Musculoskeletal: Negative for myalgias, joint swelling and arthralgias.  Skin: Negative for rash and wound.  Neurological: Negative for dizziness, weakness and headaches.  Hematological: Negative for adenopathy. Does not bruise/bleed easily.  Psychiatric/Behavioral: Positive for behavioral problems, dysphoric mood and agitation. Negative for sleep disturbance and decreased concentration. The patient is nervous/anxious.         Objective:   Physical Exam  Constitutional: She is oriented to person, place, and time. She appears well-developed and well-nourished. No distress.  HENT:  Head: Normocephalic and atraumatic.  Right Ear: External ear normal.  Left  Ear: External ear normal.  Nose: Nose normal.  Mouth/Throat: Oropharynx is clear and moist.  Eyes: Conjunctivae and EOM are normal. Pupils are equal, round, and reactive to light.  Neck: Normal range of motion. Neck supple. No JVD present. No tracheal deviation present. No thyromegaly present.  Cardiovascular: Normal rate, regular rhythm, normal heart sounds and intact distal pulses.   No murmur heard. Pulmonary/Chest: Effort normal and breath sounds normal. She has no wheezes. She exhibits no tenderness.  Abdominal: Soft. Bowel sounds are normal.  Musculoskeletal: Normal range of motion. She exhibits no edema and no tenderness.  Lymphadenopathy:    She has no cervical adenopathy.  Neurological: She is alert and oriented to person, place, and time. She has normal reflexes. No cranial nerve deficit.  Skin: Skin is warm and dry. She is not diaphoretic.  Psychiatric: She has a normal mood and affect. Her behavior is normal.    Blood pressure stable vital signs are stable.  Examination of her nasal passages shows him to be markedly swollen narrow purple in color with glistening note. No discharge. Oropharynx is clear without cobblestoning neck is supple lung fields are clear heart examination reveals regular rate and rhythm      Assessment & Plan:  Steroid reaction; increased anxiety after IM Kenalog. Short course of diazepam 5 mg twice a day and followup as needed.  For her nasal congestion I put her on a nasal spray and a mild decongestant combination over-the-counter

## 2010-09-01 NOTE — Patient Instructions (Addendum)
Take Robitussin cold and sinus tablets one 4 times a day  For the next week use 1 spray of Nasonex in each nostril twice a day followed by 1 spray of astelin in. each nostril twice a day

## 2010-09-03 ENCOUNTER — Telehealth: Payer: Self-pay | Admitting: Internal Medicine

## 2010-09-03 NOTE — Telephone Encounter (Signed)
Can you call?

## 2010-09-03 NOTE — Telephone Encounter (Signed)
Per dr Lovell Sheehan-  Give it more time- not going to get better over night

## 2010-09-03 NOTE — Telephone Encounter (Signed)
Pt. Notified.

## 2010-09-03 NOTE — Telephone Encounter (Signed)
Pt states the Diazepam is not helping her anxiety and she cannot sleep.  She still has bilateral ear stuffiness, sore throat and hoarseness.  Feels like she is choking at times.  Using Inhaler.

## 2010-09-03 NOTE — Telephone Encounter (Signed)
Patient was seen on Monday and is still having problems. Wants Bonnye to return call. Thanks.

## 2010-09-15 ENCOUNTER — Encounter: Payer: Self-pay | Admitting: Family Medicine

## 2010-09-15 ENCOUNTER — Ambulatory Visit (INDEPENDENT_AMBULATORY_CARE_PROVIDER_SITE_OTHER): Payer: 59 | Admitting: Family Medicine

## 2010-09-15 DIAGNOSIS — J309 Allergic rhinitis, unspecified: Secondary | ICD-10-CM

## 2010-09-15 NOTE — Patient Instructions (Signed)
Diet for GERD or PUD Nutrition therapy can help ease the discomfort of gastroesophageal reflux disease (GERD) and peptic ulcer disease (PUD).  HOME CARE INSTRUCTIONS  Eat your meals slowly, in a relaxed setting.   Eat 5 to 6 small meals per day.   If a food causes distress, stop eating it for a period of time.  FOODS TO AVOID:  Coffee, regular or decaffeinated.  Cola beverages, regular or low calorie.   Tea, regular or decaffeinated.   Pepper.   Cocoa.   High fat foods including meats.   Butter, margarine, hydrogenated oil (trans fats).  Peppermint or spearmint (if you have GERD).   Fruits and vegetables as tolerated.   Alcoholic beverages.   Nicotine (smoking or chewing). This is one of the most potent stimulants to acid production in the gastrointestinal tract.   Any food that seems to aggravate your condition.   If you have questions regarding your diet, call your caregiver's office or a registered dietitian. OTHER TIPS IF YOU HAVE GERD:  Lying flat may make symptoms worse. Keep the head of your bed raised 6 to 9 inches by using a foam wedge or blocks under the legs of the bed.   Do not lay down until 3 hours after eating a meal.   Daily physical activity may help reduce symptoms.  MAKE SURE YOU:   Understand these instructions.   Will watch your condition.   Will get help right away if you are not doing well or get worse.  Document Released: 01/05/2005 Document Re-Released: 05/24/2008 [facility] Patient Information 2011 [place], [place].  Continue with nasal allergy medications. Aciphex one daily.

## 2010-09-15 NOTE — Progress Notes (Signed)
  Subjective:    Patient ID: Angela Robbins, female    DOB: July 06, 1950, 60 y.o.   MRN: [id number]  HPI Patient seen with some persistent postnasal drip and nasal congestive symptoms. She has sensation of frequently needing to clear her throat. This is causing some anxiety. Recently placed diazepam 2 mg twice daily. She is not sure if this helped any. She is taking Astepro, Benadryl, and Nasonex for allergic postnasal drip. Recently placed on prednisone but intolerant with increased anxiety symptoms.  Patient relates history of frequent burping. Frequently clears her throat. No true dysphagia. Aware of occasional GERD symptoms. Intermittent dry cough. No dyspnea. Denies fever or chills.   Mucinex DM with mild relief of cough.   Nonsmoker.   Review of Systems  Constitutional: Negative for fever and chills.  HENT: Positive for congestion and postnasal drip. Negative for sore throat and voice change.   Respiratory: Positive for cough. Negative for shortness of breath and wheezing.   Hematological: Negative for adenopathy.       Objective:   Physical Exam  Constitutional: She appears well-developed and well-nourished. No distress.  HENT:  Right Ear: External ear normal.  Left Ear: External ear normal.  Mouth/Throat: Oropharynx is clear and moist.  Neck: Neck supple. No thyromegaly present.  Cardiovascular: Normal rate and regular rhythm.   Pulmonary/Chest: Effort normal and breath sounds normal. No respiratory distress. She has no wheezes. She has no rales.  Lymphadenopathy:    She has no cervical adenopathy.          Assessment & Plan:  Cough and persistent postnasal drip symptoms. Her major complaint today is that of a sensation of mucus in her posterior pharynx and needing to clear her throat frequently which may be related to postnasal drip and suspect she may have some reflux as well. Trial of samples AcipHex 20 mg daily. Continue allergy nose sprays. Patient has appointment with  allergist next month

## 2010-09-17 ENCOUNTER — Telehealth: Payer: Self-pay | Admitting: *Deleted

## 2010-09-17 NOTE — Telephone Encounter (Signed)
Notified pt. To continue the Aciphex unless she is having allergic reactions to the meds.  Feels like she has fluid in her ears?  What can she take for that?

## 2010-09-17 NOTE — Telephone Encounter (Signed)
I would try to continue on unless she is having any rash or other indicator of allergy.

## 2010-09-17 NOTE — Telephone Encounter (Signed)
Pt feels the Aciphex is giving her a more "choking sensation" ?

## 2010-09-18 NOTE — Telephone Encounter (Signed)
Pt given Dr. Burchette's recommendations. 

## 2010-09-18 NOTE — Telephone Encounter (Signed)
She is already on MULTIPLE allergy meds and cannot tolerate prednisone.  I think this will take some time.  She is scheduled to see allergist next month.

## 2010-10-08 ENCOUNTER — Other Ambulatory Visit: Payer: Self-pay | Admitting: Obstetrics & Gynecology

## 2010-10-08 DIAGNOSIS — R928 Other abnormal and inconclusive findings on diagnostic imaging of breast: Secondary | ICD-10-CM

## 2010-10-15 ENCOUNTER — Ambulatory Visit
Admission: RE | Admit: 2010-10-15 | Discharge: 2010-10-15 | Disposition: A | Discharge status: Home / Self Care | Payer: 59 | Source: Ambulatory Visit | Attending: Obstetrics & Gynecology | Admitting: Obstetrics & Gynecology

## 2010-10-15 DIAGNOSIS — R928 Other abnormal and inconclusive findings on diagnostic imaging of breast: Secondary | ICD-10-CM

## 2010-10-31 ENCOUNTER — Other Ambulatory Visit (INDEPENDENT_AMBULATORY_CARE_PROVIDER_SITE_OTHER): Payer: 59

## 2010-10-31 DIAGNOSIS — E785 Hyperlipidemia, unspecified: Secondary | ICD-10-CM

## 2010-10-31 LAB — LIPID PANEL
Cholesterol: 265 mg/dL — ABNORMAL HIGH (ref 0–200)
HDL: 90 mg/dL (ref >39.00)
Triglycerides: 69 mg/dL (ref 0.0–149.0)
VLDL: 13.8 mg/dL (ref 0.0–40.0)

## 2010-11-07 ENCOUNTER — Ambulatory Visit: Payer: 59 | Admitting: Internal Medicine

## 2010-11-13 ENCOUNTER — Ambulatory Visit (INDEPENDENT_AMBULATORY_CARE_PROVIDER_SITE_OTHER): Payer: 59 | Admitting: Internal Medicine

## 2010-11-13 ENCOUNTER — Encounter: Payer: Self-pay | Admitting: Internal Medicine

## 2010-11-13 VITALS — BP 126/80 | HR 72 | Temp 98.2°F | Resp 16 | Ht 65.0 in | Wt 144.0 lb

## 2010-11-13 DIAGNOSIS — E785 Hyperlipidemia, unspecified: Secondary | ICD-10-CM

## 2010-11-13 DIAGNOSIS — J309 Allergic rhinitis, unspecified: Secondary | ICD-10-CM

## 2010-11-13 MED ORDER — BECLOMETHASONE DIPROPIONATE 80 MCG/ACT NA AERS: 1.0000 | INHALATION_SPRAY | Freq: Two times a day (BID) | NASAL | Status: DC

## 2010-11-13 NOTE — Patient Instructions (Signed)
Patient was instructed to continue all medications as prescribed. To stop at the checkout desk and schedule a followup appointment  

## 2011-03-11 ENCOUNTER — Ambulatory Visit (INDEPENDENT_AMBULATORY_CARE_PROVIDER_SITE_OTHER): Payer: 59 | Admitting: Internal Medicine

## 2011-03-11 ENCOUNTER — Encounter: Payer: Self-pay | Admitting: Internal Medicine

## 2011-03-11 VITALS — BP 110/70 | HR 72 | Temp 98.1°F | Resp 16 | Ht 65.0 in | Wt 144.0 lb

## 2011-03-11 DIAGNOSIS — J309 Allergic rhinitis, unspecified: Secondary | ICD-10-CM

## 2011-03-11 DIAGNOSIS — J329 Chronic sinusitis, unspecified: Secondary | ICD-10-CM

## 2011-03-11 MED ORDER — CETIRIZINE HCL 10 MG PO TABS: 10.0000 mg | ORAL_TABLET | Freq: Every day | ORAL | Status: DC

## 2011-03-11 MED ORDER — METHYLPREDNISOLONE 4 MG PO KIT: PACK | ORAL | Status: AC

## 2011-03-11 MED ORDER — AZELASTINE HCL 0.15 % NA SOLN: 1.0000 | Freq: Two times a day (BID) | NASAL | Status: DC

## 2011-03-11 MED ORDER — DOXYCYCLINE HYCLATE 100 MG PO TABS: 100.0000 mg | ORAL_TABLET | Freq: Two times a day (BID) | ORAL | Status: AC

## 2011-03-11 NOTE — Patient Instructions (Signed)
Increase the zyrtec to twice a day for ten days

## 2011-03-11 NOTE — Progress Notes (Signed)
Subjective:    Patient ID: Angela Robbins, female    DOB: 07-Aug-1950, 61 y.o.   MRN: [id number]  HPI Agent is a 61 year old female who presents for followup of allergic rhinitis and chronic sinusitis.  She presents with a chief complaint of persistent nasal stuffiness more prominent on the left side than the right and injected red eyes.  She has persistent postnasal drip and a scratchy throat she denies any fever she has a hacking occasional cough but no chest congestion. She does have occasional persistence of hoarseness. She has failed Singulair and a nasal steroid spray   Review of Systems  Constitutional: Negative for activity change, appetite change and fatigue.  HENT: Negative for ear pain, congestion, neck pain, postnasal drip and sinus pressure.   Eyes: Negative for redness and visual disturbance.  Respiratory: Negative for cough, shortness of breath and wheezing.   Gastrointestinal: Negative for abdominal pain and abdominal distention.  Genitourinary: Negative for dysuria, frequency and menstrual problem.  Musculoskeletal: Negative for myalgias, joint swelling and arthralgias.  Skin: Negative for rash and wound.  Neurological: Negative for dizziness, weakness and headaches.  Hematological: Negative for adenopathy. Does not bruise/bleed easily.  Psychiatric/Behavioral: Negative for sleep disturbance and decreased concentration.   Past Medical History  Diagnosis Date  . Allergy   . Hyperlipidemia     History   Social History  . Marital Status: Married    Spouse Name: N/A    Number of Children: N/A  . Years of Education: N/A   Occupational History  . Not on file.   Social History Main Topics  . Smoking status: Never Smoker   . Smokeless tobacco: Not on file  . Alcohol Use: Not on file  . Drug Use: Not on file  . Sexually Active: Yes   Other Topics Concern  . Not on file   Social History Narrative  . No narrative on file    No past surgical history on  file.  No family history on file.  No Known Allergies  Current Outpatient Prescriptions on File Prior to Visit  Medication Sig Dispense Refill  . aspirin 81 MG EC tablet Take 81 mg by mouth daily.        . calcium carbonate (OS-CAL) 600 MG TABS Take 600 mg by mouth 2 (two) times daily with a meal.        . co-enzyme Q-10 30 MG capsule Take 30 mg by mouth 3 (three) times daily.        Marland Kitchen dextromethorphan-guaiFENesin (MUCINEX DM) 30-600 MG per 12 hr tablet Take 1 tablet by mouth every 12 (twelve) hours.        . diazepam (VALIUM) 2 MG tablet Take 1 tablet (2 mg total) by mouth every 8 (eight) hours as needed for anxiety.  30 tablet  0  . Flaxseed, Linseed, 1000 MG CAPS Take by mouth.        . magnesium 30 MG tablet Take 30 mg by mouth 2 (two) times daily.        . polycarbophil (FIBERCON) 625 MG tablet Take by mouth daily. 3 caps per day       . Red Yeast Rice 600 MG CAPS Take by mouth daily.          BP 110/70  Pulse 72  Temp 98.1 F (36.7 C)  Resp 16  Ht 5\' 5"  (1.651 m)  Wt 144 lb (65.318 kg)  BMI 23.96 kg/m2       Objective:  Physical Exam  Nursing note and vitals reviewed. Constitutional: She is oriented to person, place, and time. She appears well-developed and well-nourished. No distress.  HENT:  Head: Normocephalic and atraumatic.  Right Ear: External ear normal.  Left Ear: External ear normal.  Nose: Nose normal.  Mouth/Throat: Oropharynx is clear and moist.  Eyes: Conjunctivae and EOM are normal. Pupils are equal, round, and reactive to light.  Neck: Normal range of motion. Neck supple. No JVD present. No tracheal deviation present. No thyromegaly present.  Cardiovascular: Normal rate, regular rhythm, normal heart sounds and intact distal pulses.   No murmur heard. Pulmonary/Chest: Effort normal and breath sounds normal. She has no wheezes. She exhibits no tenderness.  Abdominal: Soft. Bowel sounds are normal.  Musculoskeletal: Normal range of motion. She  exhibits no edema and no tenderness.  Lymphadenopathy:    She has no cervical adenopathy.  Neurological: She is alert and oriented to person, place, and time. She has normal reflexes. No cranial nerve deficit.  Skin: Skin is warm and dry. She is not diaphoretic.  Psychiatric: She has a normal mood and affect. Her behavior is normal.          Assessment & Plan:  Patient is a 62 year old white female with severe allergic rhinitis nasal congestion red itchy eyes.  She has an appointment with the allergist in one month's time for further evaluation however now she suffers from severe seasonal allergies it no longer respond to her former regimen of a nasal corticosteroid and a oral antihistamine.  We will begin Astelin nasal spray one spray twice daily in each nostril and Zyrtec twice a day as well as treat her with doxycycline a brief course while she is on a Medrol dose pack.  We will monitor her symptoms if they improve dramatically then this would indicate that this is most likely allergic in etiology and she may benefit from a needle modulation of allergy shots if she is no response to the prednisone burst and taper we will defer other etiology and refer her to ear nose throat specialist

## 2011-04-02 ENCOUNTER — Other Ambulatory Visit: Payer: Self-pay | Admitting: Allergy and Immunology

## 2011-04-02 DIAGNOSIS — J329 Chronic sinusitis, unspecified: Secondary | ICD-10-CM

## 2011-04-08 ENCOUNTER — Ambulatory Visit
Admission: RE | Admit: 2011-04-08 | Discharge: 2011-04-08 | Disposition: A | Discharge status: Home / Self Care | Payer: 59 | Source: Ambulatory Visit | Attending: Allergy and Immunology | Admitting: Allergy and Immunology

## 2011-04-08 ENCOUNTER — Ambulatory Visit (INDEPENDENT_AMBULATORY_CARE_PROVIDER_SITE_OTHER): Payer: 59 | Admitting: Internal Medicine

## 2011-04-08 ENCOUNTER — Encounter: Payer: Self-pay | Admitting: Internal Medicine

## 2011-04-08 DIAGNOSIS — J320 Chronic maxillary sinusitis: Secondary | ICD-10-CM

## 2011-04-08 DIAGNOSIS — J329 Chronic sinusitis, unspecified: Secondary | ICD-10-CM

## 2011-04-08 MED ORDER — CALCIUM CARBONATE 600 MG PO TABS: 600.0000 mg | ORAL_TABLET | Freq: Every day | ORAL | Status: DC

## 2011-04-08 MED ORDER — IPRATROPIUM BROMIDE 0.06 % NA SOLN: 2.0000 | Freq: Three times a day (TID) | NASAL | Status: DC

## 2011-04-08 MED ORDER — ALIGN 4 MG PO CAPS: 1.0000 | ORAL_CAPSULE | Freq: Every day | ORAL | Status: AC

## 2011-04-08 MED ORDER — LEVOFLOXACIN 250 MG PO TABS: 250.0000 mg | ORAL_TABLET | Freq: Every day | ORAL | Status: AC

## 2011-04-08 NOTE — Patient Instructions (Signed)
The patient is instructed to continue all medications as prescribed. Schedule followup with check out clerk upon leaving the clinic Stay on probiotic align

## 2011-04-08 NOTE — Progress Notes (Signed)
Subjective:    Patient ID: Angela Robbins, female    DOB: 1950/05/31, 61 y.o.   MRN: [id number]  HPI The pt has chronic sinusitis Allergies GERD? Trial of Prilosec for GERD but not seeming to helps   Review of Systems  Constitutional: Negative for activity change, appetite change and fatigue.  HENT: Negative for ear pain, congestion, neck pain, postnasal drip and sinus pressure.   Eyes: Negative for redness and visual disturbance.  Respiratory: Negative for cough, shortness of breath and wheezing.   Gastrointestinal: Negative for abdominal pain and abdominal distention.  Genitourinary: Negative for dysuria, frequency and menstrual problem.  Musculoskeletal: Negative for myalgias, joint swelling and arthralgias.  Skin: Negative for rash and wound.  Neurological: Negative for dizziness, weakness and headaches.  Hematological: Negative for adenopathy. Does not bruise/bleed easily.  Psychiatric/Behavioral: Negative for sleep disturbance and decreased concentration.   Past Medical History  Diagnosis Date  . Allergy   . Hyperlipidemia     History   Social History  . Marital Status: Married    Spouse Name: N/A    Number of Children: N/A  . Years of Education: N/A   Occupational History  . Not on file.   Social History Main Topics  . Smoking status: Never Smoker   . Smokeless tobacco: Not on file  . Alcohol Use: Not on file  . Drug Use: Not on file  . Sexually Active: Yes   Other Topics Concern  . Not on file   Social History Narrative  . No narrative on file    No past surgical history on file.  No family history on file.  No Known Allergies  Current Outpatient Prescriptions on File Prior to Visit  Medication Sig Dispense Refill  . aspirin 81 MG EC tablet Take 81 mg by mouth daily. Takes at times      . calcium carbonate (OS-CAL) 600 MG TABS Take 600 mg by mouth 2 (two) times daily with a meal.        . cetirizine (ZYRTEC) 10 MG tablet Take 1 tablet (10 mg  total) by mouth daily.  30 tablet  11  . co-enzyme Q-10 30 MG capsule Take 30 mg by mouth 3 (three) times daily.        Marland Kitchen dextromethorphan-guaiFENesin (MUCINEX DM) 30-600 MG per 12 hr tablet Take 1 tablet by mouth every 12 (twelve) hours.        . Flaxseed, Linseed, 1000 MG CAPS Take by mouth.        . magnesium 30 MG tablet Take 30 mg by mouth 2 (two) times daily.        . polycarbophil (FIBERCON) 625 MG tablet Take by mouth daily. 3 caps per day       . Red Yeast Rice 600 MG CAPS Take by mouth daily.          BP 110/70  Pulse 72  Temp 98.3 F (36.8 C)  Resp 16  Ht 5\' 5"  (1.651 m)  Wt 144 lb (65.318 kg)  BMI 23.96 kg/m2        Objective:   Physical Exam  Nursing note and vitals reviewed. Constitutional: She is oriented to person, place, and time. She appears well-developed and well-nourished. No distress.  HENT:  Head: Normocephalic and atraumatic.  Right Ear: External ear normal.  Left Ear: External ear normal.  Nose: Nose normal.  Mouth/Throat: Oropharynx is clear and moist.  Eyes: Conjunctivae and EOM are normal. Pupils are equal, round, and reactive  to light.  Neck: Normal range of motion. Neck supple. No JVD present. No tracheal deviation present. No thyromegaly present.  Cardiovascular: Normal rate, regular rhythm, normal heart sounds and intact distal pulses.   No murmur heard. Pulmonary/Chest: Effort normal and breath sounds normal. She has no wheezes. She exhibits no tenderness.  Abdominal: Soft. Bowel sounds are normal.  Musculoskeletal: Normal range of motion. She exhibits no edema and no tenderness.  Lymphadenopathy:    She has no cervical adenopathy.  Neurological: She is alert and oriented to person, place, and time. She has normal reflexes. No cranial nerve deficit.  Skin: Skin is warm and dry. She is not diaphoretic.  Psychiatric: She has a normal mood and affect. Her behavior is normal.          Assessment & Plan:

## 2011-04-27 ENCOUNTER — Telehealth: Payer: Self-pay | Admitting: Family Medicine

## 2011-04-27 MED ORDER — FLUCONAZOLE 100 MG PO TABS: 100.0000 mg | ORAL_TABLET | Freq: Every day | ORAL | Status: AC

## 2011-04-27 NOTE — Telephone Encounter (Signed)
Pulled from Triage vmail. States she's been on abx and now has yeast infeciton in mouth & elsewhere. She stopped taking the meds after 5 days because she's "sick of all the side effects". Please call. Thanks.

## 2011-04-27 NOTE — Telephone Encounter (Signed)
Per dr Lovell Sheehan- take diflucan 100 qd for 7 days-pt informed and med sent in

## 2011-05-04 NOTE — Telephone Encounter (Signed)
Pt states she was given 21 of levaquin but could only take 5 days,due to mouth sores

## 2011-05-07 ENCOUNTER — Telehealth: Payer: Self-pay | Admitting: Internal Medicine

## 2011-05-07 DIAGNOSIS — B379 Candidiasis, unspecified: Secondary | ICD-10-CM

## 2011-05-07 MED ORDER — NYSTATIN 100000 UNIT/ML MT SUSP: 500000.0000 [IU] | Freq: Four times a day (QID) | OROMUCOSAL | Status: DC

## 2011-05-07 NOTE — Telephone Encounter (Signed)
Patient called stating that she need a refill on her difflucan. Please assist as yeast infection in mouth and vagina persists. Please send to [facility] [address].

## 2011-05-07 NOTE — Telephone Encounter (Signed)
Nystatin sent to pharmacy-please call pt- sent by dr Lovell Sheehan

## 2011-05-07 NOTE — Telephone Encounter (Signed)
LMTCB

## 2011-05-08 NOTE — Telephone Encounter (Signed)
Notified pt to pick up meds. 

## 2011-05-29 ENCOUNTER — Other Ambulatory Visit: Payer: Self-pay | Admitting: Internal Medicine

## 2011-06-10 ENCOUNTER — Other Ambulatory Visit: Payer: Self-pay | Admitting: Internal Medicine

## 2011-06-23 ENCOUNTER — Telehealth: Payer: Self-pay | Admitting: Family Medicine

## 2011-06-23 DIAGNOSIS — Z0000 Encounter for general adult medical examination without abnormal findings: Secondary | ICD-10-CM

## 2011-06-23 NOTE — Telephone Encounter (Signed)
Pt is going to [facility] lab 6/20. Can you please put in [facility] lab orders? Thanks!

## 2011-06-23 NOTE — Telephone Encounter (Signed)
done

## 2011-07-09 ENCOUNTER — Other Ambulatory Visit (INDEPENDENT_AMBULATORY_CARE_PROVIDER_SITE_OTHER): Payer: 59

## 2011-07-09 ENCOUNTER — Other Ambulatory Visit: Payer: 59

## 2011-07-09 DIAGNOSIS — Z0000 Encounter for general adult medical examination without abnormal findings: Secondary | ICD-10-CM

## 2011-07-09 LAB — LIPID PANEL
HDL: 87.7 mg/dL (ref >39.00)
Total CHOL/HDL Ratio: 3
VLDL: 8.4 mg/dL (ref 0.0–40.0)

## 2011-07-09 LAB — CBC WITH DIFFERENTIAL/PLATELET
Basophils Relative: 0.3 % (ref 0.0–3.0)
Eosinophils Relative: 2.9 % (ref 0.0–5.0)
Hemoglobin: 11.8 g/dL — ABNORMAL LOW (ref 12.0–15.0)
Lymphocytes Relative: 29.9 % (ref 12.0–46.0)
MCHC: 33.2 g/dL (ref 30.0–36.0)
Monocytes Relative: 12.2 % — ABNORMAL HIGH (ref 3.0–12.0)
Neutro Abs: 2.4 10*3/uL (ref 1.4–7.7)
Neutrophils Relative %: 54.7 % (ref 43.0–77.0)
RBC: 3.94 Mil/uL (ref 3.87–5.11)
WBC: 4.3 10*3/uL — ABNORMAL LOW (ref 4.5–10.5)

## 2011-07-09 LAB — BASIC METABOLIC PANEL
BUN: 10 mg/dL (ref 6–23)
Chloride: 106 mEq/L (ref 96–112)
Creatinine, Ser: 0.7 mg/dL (ref 0.4–1.2)
Glucose, Bld: 79 mg/dL (ref 70–99)
Potassium: 4.2 mEq/L (ref 3.5–5.1)

## 2011-07-09 LAB — HEPATIC FUNCTION PANEL
ALT: 14 U/L (ref 0–35)
AST: 27 U/L (ref 0–37)
Alkaline Phosphatase: 47 U/L (ref 39–117)
Bilirubin, Direct: 0.1 mg/dL (ref 0.0–0.3)
Total Bilirubin: 0.8 mg/dL (ref 0.3–1.2)
Total Protein: 7 g/dL (ref 6.0–8.3)

## 2011-07-09 LAB — TSH: TSH: 1.88 u[IU]/mL (ref 0.35–5.50)

## 2011-07-16 ENCOUNTER — Telehealth: Payer: Self-pay | Admitting: Internal Medicine

## 2011-07-16 ENCOUNTER — Ambulatory Visit (INDEPENDENT_AMBULATORY_CARE_PROVIDER_SITE_OTHER): Payer: 59 | Admitting: Internal Medicine

## 2011-07-16 ENCOUNTER — Encounter: Payer: Self-pay | Admitting: Internal Medicine

## 2011-07-16 VITALS — BP 110/70 | HR 72 | Temp 98.6°F | Resp 14 | Ht 64.5 in | Wt 139.0 lb

## 2011-07-16 DIAGNOSIS — Z0000 Encounter for general adult medical examination without abnormal findings: Secondary | ICD-10-CM

## 2011-07-16 NOTE — Progress Notes (Signed)
Subjective:    Patient ID: Angela Robbins, female    DOB: 1950/03/15, 61 y.o.   MRN: [id number]  HPI    Review of Systems  Constitutional: Negative for activity change, appetite change and fatigue.  HENT: Negative for ear pain, congestion, neck pain, postnasal drip and sinus pressure.        Oral thrush  Eyes: Negative for redness and visual disturbance.  Respiratory: Negative for cough, shortness of breath and wheezing.   Gastrointestinal: Negative for abdominal pain and abdominal distention.  Genitourinary: Negative for dysuria, frequency and menstrual problem.  Musculoskeletal: Negative for myalgias, joint swelling and arthralgias.  Skin: Negative for rash and wound.  Neurological: Negative for dizziness, weakness and headaches.  Hematological: Negative for adenopathy. Does not bruise/bleed easily.  Psychiatric/Behavioral: Negative for disturbed wake/sleep cycle and decreased concentration.   Past Medical History  Diagnosis Date  . Allergy   . Hyperlipidemia     History   Social History  . Marital Status: Married    Spouse Name: N/A    Number of Children: N/A  . Years of Education: N/A   Occupational History  . Not on file.   Social History Main Topics  . Smoking status: Never Smoker   . Smokeless tobacco: Not on file  . Alcohol Use: Not on file  . Drug Use: Not on file  . Sexually Active: Yes   Other Topics Concern  . Not on file   Social History Narrative  . No narrative on file    No past surgical history on file.  No family history on file.  No Known Allergies  Current Outpatient Prescriptions on File Prior to Visit  Medication Sig Dispense Refill  . calcium carbonate (OS-CAL) 600 MG TABS Take 1 tablet (600 mg total) by mouth daily.  60 tablet    . cetirizine (ZYRTEC) 10 MG tablet Take 1 tablet (10 mg total) by mouth daily.  30 tablet  11  . co-enzyme Q-10 30 MG capsule Take 30 mg by mouth 3 (three) times daily.        . Flaxseed, Linseed, 1000  MG CAPS Take by mouth.        . magnesium 30 MG tablet Take 30 mg by mouth 2 (two) times daily.        . polycarbophil (FIBERCON) 625 MG tablet Take by mouth daily. 3 caps per day       . Probiotic Product (ALIGN) 4 MG CAPS Take 1 capsule by mouth daily.      . Red Yeast Rice 600 MG CAPS Take by mouth daily.        Marland Kitchen DISCONTD: Azelastine HCl (ASTEPRO) 0.15 % SOLN Place 1 spray into the nose 2 (two) times daily.  30 mL  3    BP 110/70  Pulse 72  Temp 98.6 F (37 C)  Resp 14  Ht 5' 4.5" (1.638 m)  Wt 139 lb (63.05 kg)  BMI 23.49 kg/m2       Objective:   Physical Exam  Nursing note and vitals reviewed. Constitutional: She is oriented to person, place, and time. She appears well-developed and well-nourished. No distress.  HENT:  Head: Normocephalic and atraumatic.  Right Ear: External ear normal.  Left Ear: External ear normal.  Nose: Nose normal.  Mouth/Throat: Oropharynx is clear and moist.       Slight oral thrush  Eyes: Conjunctivae and EOM are normal. Pupils are equal, round, and reactive to light.  Neck: Normal range of  motion. Neck supple. No JVD present. No tracheal deviation present. No thyromegaly present.  Cardiovascular: Normal rate, regular rhythm and intact distal pulses.   Murmur heard. Pulmonary/Chest: Effort normal and breath sounds normal. She has no wheezes. She exhibits no tenderness.  Abdominal: Soft. Bowel sounds are normal.  Musculoskeletal: Normal range of motion. She exhibits no edema and no tenderness.  Lymphadenopathy:    She has no cervical adenopathy.  Neurological: She is alert and oriented to person, place, and time. She has normal reflexes. No cranial nerve deficit.  Skin: Skin is warm and dry. She is not diaphoretic.  Psychiatric: She has a normal mood and affect. Her behavior is normal.          Assessment & Plan:   This is a routine physical examination for this healthy  Female. Reviewed all health maintenance protocols including  mammography colonoscopy bone density and reviewed appropriate screening labs. Her immunization history was reviewed as well as her current medications and allergies refills of her chronic medications were given and the plan for yearly health maintenance was discussed all orders and referrals were made as appropriate.

## 2011-07-16 NOTE — Telephone Encounter (Signed)
Pt requesting labs please advise

## 2011-07-16 NOTE — Telephone Encounter (Signed)
[facility] is the lab

## 2011-07-16 NOTE — Telephone Encounter (Signed)
lmom for pt to call and schedule

## 2011-07-16 NOTE — Patient Instructions (Signed)
The patient is instructed to continue all medications as prescribed. Schedule followup with check out clerk upon leaving the clinic  

## 2011-10-14 NOTE — Progress Notes (Signed)
  Subjective:    Patient ID: Angela Robbins, female    DOB: November 05, 1950, 61 y.o.   MRN: [id number]  HPI Patient presents for allergic rhinitis   Review of Systems    runny nose postnasal drip and cough Objective:   Physical Exam  Erythematous turbinates swollen postnasal drip with cobblestoning no adenopathy lung fields clear heart examination regular rate and rhythm      Assessment & Plan:  Acute allergic rhinitis Discussed nasal lavage in the use of a nasal corticosteroid

## 2011-11-10 ENCOUNTER — Ambulatory Visit (INDEPENDENT_AMBULATORY_CARE_PROVIDER_SITE_OTHER): Payer: 59 | Admitting: Internal Medicine

## 2011-11-10 DIAGNOSIS — Z23 Encounter for immunization: Secondary | ICD-10-CM

## 2012-01-14 ENCOUNTER — Other Ambulatory Visit: Payer: 59

## 2012-01-21 ENCOUNTER — Ambulatory Visit: Payer: 59 | Admitting: Internal Medicine

## 2012-02-23 ENCOUNTER — Other Ambulatory Visit (INDEPENDENT_AMBULATORY_CARE_PROVIDER_SITE_OTHER): Payer: 59

## 2012-02-23 DIAGNOSIS — D649 Anemia, unspecified: Secondary | ICD-10-CM

## 2012-02-23 LAB — CBC WITH DIFFERENTIAL/PLATELET
Basophils Relative: 0.5 % (ref 0.0–3.0)
Eosinophils Absolute: 0.1 10*3/uL (ref 0.0–0.7)
Eosinophils Relative: 2.3 % (ref 0.0–5.0)
HCT: 34.5 % — ABNORMAL LOW (ref 36.0–46.0)
Hemoglobin: 11.8 g/dL — ABNORMAL LOW (ref 12.0–15.0)
MCHC: 34.2 g/dL (ref 30.0–36.0)
MCV: 88.6 fl (ref 78.0–100.0)
Monocytes Absolute: 0.4 10*3/uL (ref 0.1–1.0)
Neutro Abs: 1.7 10*3/uL (ref 1.4–7.7)
Neutrophils Relative %: 52.4 % (ref 43.0–77.0)
RBC: 3.89 Mil/uL (ref 3.87–5.11)
WBC: 3.2 10*3/uL — ABNORMAL LOW (ref 4.5–10.5)

## 2012-03-07 ENCOUNTER — Encounter: Payer: Self-pay | Admitting: Internal Medicine

## 2012-03-07 ENCOUNTER — Ambulatory Visit (INDEPENDENT_AMBULATORY_CARE_PROVIDER_SITE_OTHER): Payer: 59 | Admitting: Internal Medicine

## 2012-03-07 VITALS — BP 126/74 | HR 72 | Temp 98.0°F | Resp 16 | Ht 64.5 in | Wt 142.0 lb

## 2012-03-07 DIAGNOSIS — E785 Hyperlipidemia, unspecified: Secondary | ICD-10-CM

## 2012-03-07 DIAGNOSIS — D509 Iron deficiency anemia, unspecified: Secondary | ICD-10-CM

## 2012-03-07 MED ORDER — FUSION PLUS PO CAPS: 1.0000 | ORAL_CAPSULE | Freq: Every day | ORAL | Status: DC

## 2012-03-07 NOTE — Patient Instructions (Addendum)
anemia

## 2012-03-07 NOTE — Progress Notes (Signed)
  Subjective:    Patient ID: Angela Robbins, female    DOB: [birth year], 62 y.o.   MRN: [id number]  HPIPatient is a 62 year old female who presents for followup of allergic rhinitis hyperlipidemia and anemia.  She is iron deficiency anemia and states that when she saw her gynecologist in January her hemoglobin increased to 13.  She was taking a multivitamin with iron but has stopped    Review of Systems  Constitutional: Negative for activity change, appetite change and fatigue.  HENT: Negative for ear pain, congestion, neck pain, postnasal drip and sinus pressure.   Eyes: Negative for redness and visual disturbance.  Respiratory: Negative for cough, shortness of breath and wheezing.   Gastrointestinal: Negative for abdominal pain and abdominal distention.  Genitourinary: Negative for dysuria, frequency and menstrual problem.  Musculoskeletal: Negative for myalgias, joint swelling and arthralgias.  Skin: Negative for rash and wound.  Neurological: Negative for dizziness, weakness and headaches.  Hematological: Negative for adenopathy. Does not bruise/bleed easily.  Psychiatric/Behavioral: Negative for sleep disturbance and decreased concentration.       Objective:   Physical Exam  Constitutional: She is oriented to person, place, and time. She appears well-developed and well-nourished. No distress.  HENT:  Head: Normocephalic and atraumatic.  Right Ear: External ear normal.  Left Ear: External ear normal.  Nose: Nose normal.  Mouth/Throat: Oropharynx is clear and moist.  Eyes: Conjunctivae and EOM are normal. Pupils are equal, round, and reactive to light.  Neck: Normal range of motion. Neck supple. No JVD present. No tracheal deviation present. No thyromegaly present.  Cardiovascular: Normal rate, regular rhythm, normal heart sounds and intact distal pulses.   No murmur heard. Pulmonary/Chest: Effort normal and breath sounds normal. She has no wheezes. She exhibits no  tenderness.  Abdominal: Soft. Bowel sounds are normal.  Musculoskeletal: Normal range of motion. She exhibits no edema and no tenderness.  Lymphadenopathy:    She has no cervical adenopathy.  Neurological: She is alert and oriented to person, place, and time. She has normal reflexes. No cranial nerve deficit.  Skin: Skin is warm and dry. She is not diaphoretic. There is pallor.  Psychiatric: She has a normal mood and affect. Her behavior is normal.          Assessment & Plan:  Iron deficiency anemia we'll put her on a prescription iron tablets.  Irritable bowel stable on current regimen.  Hyperlipidemia with HDL of 90.  Allergic rhinitis chronic use of Zyrtec recommended

## 2012-07-21 ENCOUNTER — Other Ambulatory Visit: Payer: 59

## 2012-07-27 ENCOUNTER — Other Ambulatory Visit (INDEPENDENT_AMBULATORY_CARE_PROVIDER_SITE_OTHER): Payer: 59

## 2012-07-27 DIAGNOSIS — Z0000 Encounter for general adult medical examination without abnormal findings: Secondary | ICD-10-CM

## 2012-07-27 LAB — HEPATIC FUNCTION PANEL
ALT: 19 U/L (ref 0–35)
AST: 30 U/L (ref 0–37)
Albumin: 3.9 g/dL (ref 3.5–5.2)
Alkaline Phosphatase: 48 U/L (ref 39–117)
Total Protein: 6.8 g/dL (ref 6.0–8.3)

## 2012-07-27 LAB — POCT URINALYSIS DIPSTICK
Ketones, UA: NEGATIVE
Leukocytes, UA: NEGATIVE
Protein, UA: NEGATIVE
pH, UA: 7

## 2012-07-27 LAB — CBC WITH DIFFERENTIAL/PLATELET
Basophils Relative: 0.4 % (ref 0.0–3.0)
Eosinophils Absolute: 0.1 10*3/uL (ref 0.0–0.7)
Eosinophils Relative: 2.1 % (ref 0.0–5.0)
Lymphocytes Relative: 29.3 % (ref 12.0–46.0)
MCHC: 34.7 g/dL (ref 30.0–36.0)
Neutrophils Relative %: 56.2 % (ref 43.0–77.0)
RBC: 3.95 Mil/uL (ref 3.87–5.11)
WBC: 3.7 10*3/uL — ABNORMAL LOW (ref 4.5–10.5)

## 2012-07-27 LAB — LIPID PANEL
HDL: 86.7 mg/dL (ref >39.00)
Triglycerides: 77 mg/dL (ref 0.0–149.0)

## 2012-07-27 LAB — BASIC METABOLIC PANEL
Calcium: 9.2 mg/dL (ref 8.4–10.5)
Creatinine, Ser: 0.7 mg/dL (ref 0.4–1.2)

## 2012-07-29 ENCOUNTER — Encounter: Payer: 59 | Admitting: Internal Medicine

## 2012-08-12 ENCOUNTER — Encounter: Payer: Self-pay | Admitting: Internal Medicine

## 2012-08-12 ENCOUNTER — Ambulatory Visit (INDEPENDENT_AMBULATORY_CARE_PROVIDER_SITE_OTHER): Payer: 59 | Admitting: Internal Medicine

## 2012-08-12 VITALS — BP 118/76 | HR 72 | Temp 98.0°F | Resp 16 | Ht 64.5 in | Wt 140.0 lb

## 2012-08-12 DIAGNOSIS — Z0000 Encounter for general adult medical examination without abnormal findings: Secondary | ICD-10-CM

## 2012-08-12 MED ORDER — OMEGA-3 KRILL OIL 300 MG PO CAPS: 1.0000 | ORAL_CAPSULE | Freq: Two times a day (BID) | ORAL | Status: DC

## 2012-08-12 NOTE — Patient Instructions (Addendum)
° °  Iron-Rich Diet ° °An iron-rich diet contains foods that are good sources of iron. Iron is an important mineral that helps your body produce hemoglobin. Hemoglobin is a protein in red blood cells that carries oxygen to the body's tissues. Sometimes, the iron level in your blood can be low. This may be caused by: °· A lack of iron in your diet. °· Blood loss. °· Times of growth, such as during pregnancy or during a child's growth and development. °Low levels of iron can cause a decrease in the number of red blood cells. This can result in iron deficiency anemia. Iron deficiency anemia symptoms include: °· Tiredness. °· Weakness. °· Irritability. °· Increased chance of infection. °Here are some recommendations for daily iron intake: °· Males older than 63 years of age need 8 mg of iron per day. °· Women ages 19 to 50 need 18 mg of iron per day. °· Pregnant women need 27 mg of iron per day, and women who are over 19 years of age and breastfeeding need 9 mg of iron per day. °· Women over the age of 50 need 8 mg of iron per day. °SOURCES OF IRON °There are 2 types of iron that are found in food: heme iron and nonheme iron. Heme iron is absorbed by the body better than nonheme iron. Heme iron is found in meat, poultry, and fish. Nonheme iron is found in grains, beans, and vegetables. °Heme Iron Sources °Food / Iron (mg) °· Chicken liver, 3 oz (85 g)/ 10 mg °· Beef liver, 3 oz (85 g)/ 5.5 mg °· Oysters, 3 oz (85 g)/ 8 mg °· Beef, 3 oz (85 g)/ 2 to 3 mg °· Shrimp, 3 oz (85 g)/ 2.8 mg °· Turkey, 3 oz (85 g)/ 2 mg °· Chicken, 3 oz (85 g) / 1 mg °· Fish (tuna, halibut), 3 oz (85 g)/ 1 mg °· Pork, 3 oz (85 g)/ 0.9 mg °Nonheme Iron Sources °Food / Iron (mg) °· Ready-to-eat breakfast cereal, iron-fortified / 3.9 to 7 mg °· Tofu, ½ cup / 3.4 mg °· Kidney beans, ½ cup / 2.6 mg °· Baked potato with skin / 2.7 mg °· Asparagus, ½ cup / 2.2 mg °· Avocado / 2 mg °· Dried peaches, ½ cup / 1.6 mg °· Raisins, ½ cup / 1.5 mg °· Soy milk,  1 cup / 1.5 mg °· Whole-wheat bread, 1 slice / 1.2 mg °· Spinach, 1 cup / 0.8 mg °· Broccoli, ½ cup / 0.6 mg °IRON ABSORPTION °Certain foods can decrease the body's absorption of iron. Try to avoid these foods and beverages while eating meals with iron-containing foods: °· Coffee. °· Tea. °· Fiber. °· Soy. °Foods containing vitamin C can help increase the amount of iron your body absorbs from iron sources, especially from nonheme sources. Eat foods with vitamin C along with iron-containing foods to increase your iron absorption. Foods that are high in vitamin C include many fruits and vegetables. Some good sources are: °· Fresh orange juice. °· Oranges. °· Strawberries. °· Mangoes. °· Grapefruit. °· Red bell peppers. °· Green bell peppers. °· Broccoli. °· Potatoes with skin. °· Tomato juice. °Document Released: 08/19/2004 Document Revised: 03/30/2011 Document Reviewed: 06/26/2010 °[employer]® Patient Information ©2014 [employer]. ° °

## 2012-08-12 NOTE — Progress Notes (Signed)
Subjective:    Patient ID: Angela Robbins, female    DOB: 12-14-1950, 62 y.o.   MRN: [id number]  HPI Patient is a 62 year old female who is followed for borderline hyperlipidemia with a high HDL but also a high direct LDL and also has a mild chronic anemia.  She presents for yearly examination   Review of Systems  Constitutional: Negative for activity change, appetite change and fatigue.  HENT: Negative for ear pain, congestion, neck pain, postnasal drip and sinus pressure.   Eyes: Negative for redness and visual disturbance.  Respiratory: Negative for cough, shortness of breath and wheezing.   Gastrointestinal: Negative for abdominal pain and abdominal distention.  Genitourinary: Negative for dysuria, frequency and menstrual problem.  Musculoskeletal: Negative for myalgias, joint swelling and arthralgias.  Skin: Negative for rash and wound.  Neurological: Negative for dizziness, weakness and headaches.  Hematological: Negative for adenopathy. Does not bruise/bleed easily.  Psychiatric/Behavioral: Negative for sleep disturbance and decreased concentration.   Past Medical History  Diagnosis Date  . Allergy   . Hyperlipidemia     History   Social History  . Marital Status: Married    Spouse Name: N/A    Number of Children: N/A  . Years of Education: N/A   Occupational History  . Not on file.   Social History Main Topics  . Smoking status: Never Smoker   . Smokeless tobacco: Not on file  . Alcohol Use: Not on file  . Drug Use: Not on file  . Sexually Active: Yes   Other Topics Concern  . Not on file   Social History Narrative  . No narrative on file    No past surgical history on file.  No family history on file.  No Known Allergies  Current Outpatient Prescriptions on File Prior to Visit  Medication Sig Dispense Refill  . BIOTIN 5000 PO Take 1 tablet by mouth daily.      . Cholecalciferol (VITAMIN D3) 1000 UNITS CAPS Take 1 capsule by mouth daily.       . Garlic 100 MG TABS Take 1 tablet by mouth daily.      . magnesium 30 MG tablet Take 30 mg by mouth 2 (two) times daily.        . NON FORMULARY 1 tablet. kyolic acid and garlic      . polycarbophil (FIBERCON) 625 MG tablet Take by mouth daily. 3 caps per day       . Probiotic Product (ALIGN) 4 MG CAPS Take 1 capsule by mouth daily.      . vitamin C (ASCORBIC ACID) 500 MG tablet Take 500 mg by mouth daily.      . calcium carbonate (OS-CAL) 600 MG TABS Take 1 tablet (600 mg total) by mouth daily.  60 tablet    . cetirizine (ZYRTEC) 10 MG tablet Take 1 tablet (10 mg total) by mouth daily.  30 tablet  11  . co-enzyme Q-10 30 MG capsule Take 30 mg by mouth 3 (three) times daily.        . Flaxseed, Linseed, 1000 MG CAPS Take by mouth.        . [DISCONTINUED] Azelastine HCl (ASTEPRO) 0.15 % SOLN Place 1 spray into the nose 2 (two) times daily.  30 mL  3   No current facility-administered medications on file prior to visit.    BP 118/76  Pulse 72  Temp(Src) 98 F (36.7 C)  Resp 16  Ht 5' 4.5" (1.638 m)  Wt 140  lb (63.504 kg)  BMI 23.67 kg/m2        Objective:   Physical Exam  Vitals reviewed. Constitutional: She is oriented to person, place, and time. She appears well-developed and well-nourished. No distress.  HENT:  Head: Normocephalic and atraumatic.  Right Ear: External ear normal.  Left Ear: External ear normal.  Nose: Nose normal.  Mouth/Throat: Oropharynx is clear and moist.  Eyes: Conjunctivae and EOM are normal. Pupils are equal, round, and reactive to light.  Neck: Normal range of motion. Neck supple. No JVD present. No tracheal deviation present. No thyromegaly present.  Cardiovascular: Normal rate, regular rhythm, normal heart sounds and intact distal pulses.   No murmur heard. Pulmonary/Chest: Effort normal and breath sounds normal. She has no wheezes. She exhibits no tenderness.  Abdominal: Soft. Bowel sounds are normal.  Musculoskeletal: Normal range of motion.  She exhibits no edema and no tenderness.  Lymphadenopathy:    She has no cervical adenopathy.  Neurological: She is alert and oriented to person, place, and time. She has normal reflexes. No cranial nerve deficit.  Skin: Skin is warm and dry. She is not diaphoretic.  Psychiatric: She has a normal mood and affect. Her behavior is normal.          Assessment & Plan:   This is a routine physical examination for this healthy  Female. Reviewed all health maintenance protocols including mammography colonoscopy bone density and reviewed appropriate screening labs. Her immunization history was reviewed as well as her current medications and allergies refills of her chronic medications were given and the plan for yearly health maintenance was discussed all orders and referrals were made as appropriate.   Because of the iron deficiency anemia and her inability to tolerate oral iron supplements due to constipation we are going to recommend certain root vegetables and raisins as a source of iron in her diet.

## 2012-08-17 ENCOUNTER — Encounter: Payer: Self-pay | Admitting: *Deleted

## 2012-08-18 ENCOUNTER — Encounter: Payer: Self-pay | Admitting: Internal Medicine

## 2012-08-22 ENCOUNTER — Encounter: Payer: Self-pay | Admitting: Internal Medicine

## 2012-09-21 ENCOUNTER — Telehealth: Payer: Self-pay | Admitting: Internal Medicine

## 2012-09-21 NOTE — Telephone Encounter (Signed)
Pt states she usually has labs done prior to her fup. Pt is scheduled in jan and states she needs chloles labs. Pt states this is also same for husband pls advise.

## 2012-09-21 NOTE — Telephone Encounter (Signed)
What labs would like done?  She had her cpx in July

## 2012-09-26 NOTE — Telephone Encounter (Signed)
May have lipid and liver 1 week sprior and husband may have the same thing.; diagnoses code is 272.4 please find out husband name and dob when you call wife

## 2012-09-26 NOTE — Telephone Encounter (Signed)
schedule lipid and liver

## 2012-09-27 ENCOUNTER — Encounter: Payer: Self-pay | Admitting: Internal Medicine

## 2012-09-27 ENCOUNTER — Other Ambulatory Visit (INDEPENDENT_AMBULATORY_CARE_PROVIDER_SITE_OTHER): Payer: 59

## 2012-09-27 ENCOUNTER — Ambulatory Visit (INDEPENDENT_AMBULATORY_CARE_PROVIDER_SITE_OTHER): Payer: 59 | Admitting: Internal Medicine

## 2012-09-27 VITALS — BP 90/60 | HR 60 | Ht 64.5 in | Wt 143.4 lb

## 2012-09-27 DIAGNOSIS — Z860100 Personal history of colon polyps, unspecified: Secondary | ICD-10-CM | POA: Insufficient documentation

## 2012-09-27 DIAGNOSIS — K602 Anal fissure, unspecified: Secondary | ICD-10-CM

## 2012-09-27 DIAGNOSIS — K589 Irritable bowel syndrome without diarrhea: Secondary | ICD-10-CM

## 2012-09-27 DIAGNOSIS — D509 Iron deficiency anemia, unspecified: Secondary | ICD-10-CM

## 2012-09-27 HISTORY — DX: Anal fissure, unspecified: K60.2

## 2012-09-27 LAB — IGA: IgA: 120 mg/dL (ref 68–378)

## 2012-09-27 MED ORDER — NA SULFATE-K SULFATE-MG SULF 17.5-3.13-1.6 GM/177ML PO SOLN: 1.0000 | Freq: Once | ORAL | Status: DC

## 2012-09-27 MED ORDER — AMBULATORY NON FORMULARY MEDICATION: Status: DC

## 2012-09-27 NOTE — Assessment & Plan Note (Addendum)
Repeat routine colonoscopy - October, after some healing of fissure

## 2012-09-27 NOTE — Assessment & Plan Note (Signed)
Anterior Treat with 0.125% NTG ointment tid-qid Benefiber 2 - 3 tablespoons daily recticare prn

## 2012-09-27 NOTE — Assessment & Plan Note (Addendum)
I only see normocytic indices and Hgb ok - ? If accurate ? If she could have celiac dz - check TTG Ab and IgA level

## 2012-09-27 NOTE — Assessment & Plan Note (Signed)
Long-standing dx - ? If actually celiac disease

## 2012-09-27 NOTE — Progress Notes (Signed)
  Subjective:    Patient ID: Angela Robbins, female    DOB: [birth year], 62 y.o.   MRN: [id number]  HPI This is a very nice lady known from screening colonoscopy 5 yrs ago - she had an adenoma. She also has a dx of IBS. She has been having bloating and gas, alternating bowel habits with constipation and diarrhea, as well as painful defecation and rectal bleeding. Has a hx of anal fissure and surgery yrs ago. IBS dx and sxs x years - even a s a child. She was treated with "large doses of antibiotics as a child" and wonders if that is causing problems. Has had a stressful year with loss of father earlier 2014. She is trying a probiotic. She has used some fiber supplements in the past. Denies straining to stool too much. She has been treated with iron for anemia over past few years.  Medications, allergies, past medical history, past surgical history, family history and social history are reviewed and updated in the EMR.  Review of Systems + allergies, fatigue, headaches, cramps, skin rash All other ROS negative    Objective:   Physical Exam General:  Well-developed, well-nourished and in no acute distress Eyes:  anicteric. ENT:   Mouth and posterior pharynx free of lesions.  Neck:   supple w/o thyromegaly or mass.  Lungs: Clear to auscultation bilaterally. Heart:  S1S2, no rubs, murmurs, gallops. Abdomen:  soft, non-tender, no hepatosplenomegaly, hernia, or mass and BS+.  Rectal: Female staff present - + anterior sentinel pile, mild anal stenosis and tenderness no mass - 0.125% NTG ointment and recticare applied with relief Lymph:  no cervical or supraclavicular adenopathy. Extremities:   no edema Skin   no rash. Neuro:  A&O x 3.  Psych:  appropriate mood and  Affect.   Data Reviewed:  Lab Results  Component Value Date   WBC 3.7* 07/27/2012   HGB 12.3 07/27/2012   HCT 35.6* 07/27/2012   MCV 90.0 07/27/2012   PLT 208.0 07/27/2012      Assessment & Plan:  Anal fissure  IDA (iron  deficiency anemia) - Plan: Tissue transglutaminase, IgA, IgA, Ambulatory referral to Gastroenterology  IBS (irritable bowel syndrome)  Personal history of colonic adenoma

## 2012-09-27 NOTE — Patient Instructions (Addendum)
You can purchase Recticare over the counter for anal pain. We have sent  medications to your pharmacy for you to pick up at your convenience We have given you a Benefiber, Anal fissure and celiac disease handout as well as information on Hemorrhoid banding. Your physician has requested that you go to the basement for  lab work before leaving today. You have been scheduled for a colonoscopy with propofol. Please follow written instructions given to you at your visit today.  Please pick up your prep kit at the pharmacy within the next 1-3 days. If you use inhalers (even only as needed), please bring them with you on the day of your procedure. Your physician has requested that you go to www.startemmi.com and enter the access code given to you at your visit today. This web site gives a general overview about your procedure. However, you should still follow specific instructions given to you by our office regarding your preparation for the procedure. CC:  Darryll Capers MD

## 2012-09-28 ENCOUNTER — Telehealth: Payer: Self-pay

## 2012-09-28 ENCOUNTER — Encounter: Payer: Self-pay | Admitting: Internal Medicine

## 2012-09-28 LAB — TISSUE TRANSGLUTAMINASE, IGA: Tissue Transglutaminase Ab, IgA: 3.3 U/mL (ref <20)

## 2012-09-28 NOTE — Telephone Encounter (Signed)
Message copied by Donata Duff on Wed Sep 28, 2012  4:45 PM ------      Message from: Stan Head E      Created: Wed Sep 28, 2012  4:27 PM      Regarding: no celiac       Let her know testing for celiac disease (gluten allergy) was negative ------

## 2012-09-28 NOTE — Telephone Encounter (Signed)
The patient has been notified of this information and all questions answered.

## 2012-10-21 ENCOUNTER — Ambulatory Visit (INDEPENDENT_AMBULATORY_CARE_PROVIDER_SITE_OTHER): Payer: 59

## 2012-10-21 DIAGNOSIS — Z23 Encounter for immunization: Secondary | ICD-10-CM

## 2012-12-07 ENCOUNTER — Encounter: Payer: Self-pay | Admitting: Internal Medicine

## 2012-12-07 ENCOUNTER — Ambulatory Visit (AMBULATORY_SURGERY_CENTER): Payer: 59 | Admitting: Internal Medicine

## 2012-12-07 VITALS — BP 119/58 | HR 42 | Temp 97.5°F | Resp 20 | Ht 64.5 in | Wt 143.0 lb

## 2012-12-07 DIAGNOSIS — Z86010 Personal history of colonic polyps: Secondary | ICD-10-CM

## 2012-12-07 DIAGNOSIS — D128 Benign neoplasm of rectum: Secondary | ICD-10-CM

## 2012-12-07 MED ORDER — SODIUM CHLORIDE 0.9 % IV SOLN: 500.0000 mL | INTRAVENOUS | Status: DC

## 2012-12-07 NOTE — Progress Notes (Signed)
Called to room to assist during endoscopic procedure.  Patient ID and intended procedure confirmed with present staff. Received instructions for my participation in the procedure from the performing physician.  

## 2012-12-07 NOTE — Progress Notes (Signed)
Patient denies any allergies to eggs or soy. 

## 2012-12-07 NOTE — Op Note (Signed)
[facility] [address] [place], [address]   COLONOSCOPY PROCEDURE REPORT  PATIENT: Angela Robbins, Angela Robbins  MR#: [id number] BIRTHDATE: [birth year] , 61  yrs. old GENDER: Female ENDOSCOPIST: Iva Boop, MD, [facility] PROCEDURE DATE:  12/07/2012 PROCEDURE:   Colonoscopy with snare polypectomy First Screening Colonoscopy - Avg.  risk and is 50 yrs.  old or older - No.  Prior Negative Screening - Now for repeat screening. N/A  History of Adenoma - Now for follow-up colonoscopy & has been > or = to 3 yrs.  Yes hx of adenoma.  Has been 3 or more years since last colonoscopy.  Polyps Removed Today? Yes. ASA CLASS:   Class II INDICATIONS:Patient's personal history of adenomatous colon polyps.  MEDICATIONS: propofol (Diprivan) 300mg  IV, MAC sedation, administered by CRNA, and These medications were titrated to patient response per physician's verbal order  DESCRIPTION OF PROCEDURE:   After the risks benefits and alternatives of the procedure were thoroughly explained, informed consent was obtained.  A digital rectal exam revealed stenosis of the anal canal and A digital rectal exam revealed no rectal mass. The LB [id number]  endoscope was introduced through the anus and advanced to the cecum, which was identified by both the appendix and ileocecal valve. No adverse events experienced.   The quality of the prep was excellent using Suprep  The instrument was then slowly withdrawn as the colon was fully examined.      COLON FINDINGS: A diminutive sessile polyp was found in the rectum. A polypectomy was performed with a cold snare.  The resection was complete and the polyp tissue was completely retrieved.   The colon mucosa was otherwise normal.  Retroflexed views revealed an anal fissure. The time to cecum=5 minutes 53 seconds.  Withdrawal time=9 minutes 57 seconds.  The scope was withdrawn and the procedure completed. COMPLICATIONS: There were no  complications.  ENDOSCOPIC IMPRESSION: 1.   Diminutive sessile polyp was found in the rectum; polypectomy was performed with a cold snare 2.   The colon mucosa was otherwise normal - excellent prep - hx diminutive adenoma 2009 3.   Healing anal fissure  RECOMMENDATIONS: Timing of repeat colonoscopy will be determined by pathology findings. Continue NTG to anal area and RTC prn if it does not resolve eSigned:  Iva Boop, MD, [facility] 12/07/2012 8:37 AM  cc: The Patient

## 2012-12-07 NOTE — Progress Notes (Signed)
Procedure ends, to recovery, report given and VSS. 

## 2012-12-07 NOTE — Progress Notes (Signed)
Patient did not experience any of the following events: a burn prior to discharge; a fall within the facility; wrong site/side/patient/procedure/implant event; or a hospital transfer or hospital admission upon discharge from the facility. (G8907) Patient did not have preoperative order for IV antibiotic SSI prophylaxis. (G8918)  

## 2012-12-07 NOTE — Progress Notes (Addendum)
Pt. Heart decreased to 38 made Dr. Leone Payor aware. Physician in to evaluate. Stated get her up and moving and get her to pass more air. Pt. Repositioned and expelled air and then disconnected from monitor to allow mobility. Ambulated to bathroom. Pt. Expelled more air, stated that she feels better. Denies dizziness when ambulated to bathroom,pt. Placed back on monitor for heart rate =44, doctor made aware and instructed to discharge pt. Pt. Given fluids to drink and instructed to move slowly and drink plenty of fluids and to monitor heart rate, pt. And spouse verbalize understanding.

## 2012-12-07 NOTE — Patient Instructions (Addendum)
I found and removed one very small rectal polyp. The rest of the colon looked ok. I think the fissure is much better.  I will let you know pathology results and when to have another routine colonoscopy by mail.  If you have persistent problems with your fissure I will probably need to see you back in the office.  I appreciate the opportunity to care for you. Iva Boop, MD, FACG     YOU HAD AN ENDOSCOPIC PROCEDURE TODAY AT THE [facility]: Refer to the procedure report that was given to you for any specific questions about what was found during the examination.  If the procedure report does not answer your questions, please call your gastroenterologist to clarify.  If you requested that your care partner not be given the details of your procedure findings, then the procedure report has been included in a sealed envelope for you to review at your convenience later.  YOU SHOULD EXPECT: Some feelings of bloating in the abdomen. Passage of more gas than usual.  Walking can help get rid of the air that was put into your GI tract during the procedure and reduce the bloating. If you had a lower endoscopy (such as a colonoscopy or flexible sigmoidoscopy) you may notice spotting of blood in your stool or on the toilet paper. If you underwent a bowel prep for your procedure, then you may not have a normal bowel movement for a few days.  DIET: Your first meal following the procedure should be a light meal and then it is ok to progress to your normal diet.  A half-sandwich or bowl of soup is an example of a good first meal.  Heavy or fried foods are harder to digest and may make you feel nauseous or bloated.  Likewise meals heavy in dairy and vegetables can cause extra gas to form and this can also increase the bloating.  Drink plenty of fluids but you should avoid alcoholic beverages for 24 hours.  ACTIVITY: Your care partner should take you home directly after the procedure.  You should  plan to take it easy, moving slowly for the rest of the day.  You can resume normal activity the day after the procedure however you should NOT DRIVE or use heavy machinery for 24 hours (because of the sedation medicines used during the test).    SYMPTOMS TO REPORT IMMEDIATELY: A gastroenterologist can be reached at any hour.  During normal business hours, 8:30 AM to 5:00 PM Monday through Friday, call [phone].  After hours and on weekends, please call the GI answering service at [phone] who will take a message and have the physician on call contact you.   Following lower endoscopy (colonoscopy or flexible sigmoidoscopy):  Excessive amounts of blood in the stool  Significant tenderness or worsening of abdominal pains  Swelling of the abdomen that is new, acute  Fever of 100F or higher  FOLLOW UP: If any biopsies were taken you will be contacted by phone or by letter within the next 1-3 weeks.  Call your gastroenterologist if you have not heard about the biopsies in 3 weeks.  Our staff will call the home number listed on your records the next business day following your procedure to check on you and address any questions or concerns that you may have at that time regarding the information given to you following your procedure. This is a courtesy call and so if there is no answer at the home  number and we have not heard from you through the emergency physician on call, we will assume that you have returned to your regular daily activities without incident.  SIGNATURES/CONFIDENTIALITY: You and/or your care partner have signed paperwork which will be entered into your electronic medical record.  These signatures attest to the fact that that the information above on your After Visit Summary has been reviewed and is understood.  Full responsibility of the confidentiality of this discharge information lies with you and/or your care-partner.  Resume medications. Information given on polyps  with discharge instructions.

## 2012-12-08 ENCOUNTER — Telehealth: Payer: Self-pay | Admitting: *Deleted

## 2012-12-08 NOTE — Telephone Encounter (Signed)
  Follow up Call-  Call back number 12/07/2012  Post procedure Call Back phone  # [phone]  Permission to leave phone message Yes     Patient questions:  Do you have a fever, pain , or abdominal swelling? no Pain Score  0 *  Have you tolerated food without any problems? yes  Have you been able to return to your normal activities? yes  Do you have any questions about your discharge instructions: Diet   no Medications  no Follow up visit  no  Do you have questions or concerns about your Care? no  Actions: * If pain score is 4 or above: No action needed, pain <4.

## 2012-12-12 ENCOUNTER — Encounter: Payer: Self-pay | Admitting: Internal Medicine

## 2012-12-12 NOTE — Progress Notes (Signed)
Quick Note:  Hyperplastic polyp Repeat colonoscopy 2024 ______

## 2013-01-31 ENCOUNTER — Other Ambulatory Visit (INDEPENDENT_AMBULATORY_CARE_PROVIDER_SITE_OTHER): Payer: 59

## 2013-01-31 DIAGNOSIS — E785 Hyperlipidemia, unspecified: Secondary | ICD-10-CM

## 2013-01-31 DIAGNOSIS — D509 Iron deficiency anemia, unspecified: Secondary | ICD-10-CM

## 2013-01-31 LAB — CBC WITH DIFFERENTIAL/PLATELET
BASOS ABS: 0 10*3/uL (ref 0.0–0.1)
Basophils Relative: 0.4 % (ref 0.0–3.0)
EOS ABS: 0 10*3/uL (ref 0.0–0.7)
Eosinophils Relative: 1.5 % (ref 0.0–5.0)
HEMATOCRIT: 37.2 % (ref 36.0–46.0)
HEMOGLOBIN: 12.8 g/dL (ref 12.0–15.0)
LYMPHS ABS: 1.1 10*3/uL (ref 0.7–4.0)
Lymphocytes Relative: 31.7 % (ref 12.0–46.0)
MCHC: 34.3 g/dL (ref 30.0–36.0)
MCV: 88.8 fl (ref 78.0–100.0)
MONO ABS: 0.4 10*3/uL (ref 0.1–1.0)
Monocytes Relative: 11.7 % (ref 3.0–12.0)
NEUTROS ABS: 1.8 10*3/uL (ref 1.4–7.7)
Neutrophils Relative %: 54.7 % (ref 43.0–77.0)
Platelets: 207 10*3/uL (ref 150.0–400.0)
RBC: 4.18 Mil/uL (ref 3.87–5.11)
RDW: 13.6 % (ref 11.5–14.6)
WBC: 3.4 10*3/uL — ABNORMAL LOW (ref 4.5–10.5)

## 2013-01-31 LAB — HEPATIC FUNCTION PANEL
ALT: 19 U/L (ref 0–35)
AST: 26 U/L (ref 0–37)
Albumin: 4 g/dL (ref 3.5–5.2)
Alkaline Phosphatase: 53 U/L (ref 39–117)
BILIRUBIN DIRECT: 0 mg/dL (ref 0.0–0.3)
TOTAL PROTEIN: 7.1 g/dL (ref 6.0–8.3)
Total Bilirubin: 0.6 mg/dL (ref 0.3–1.2)

## 2013-01-31 LAB — LIPID PANEL
Cholesterol: 266 mg/dL — ABNORMAL HIGH (ref 0–200)
HDL: 72.3 mg/dL (ref >39.00)
Total CHOL/HDL Ratio: 4
Triglycerides: 130 mg/dL (ref 0.0–149.0)
VLDL: 26 mg/dL (ref 0.0–40.0)

## 2013-02-01 ENCOUNTER — Other Ambulatory Visit: Payer: 59

## 2013-02-01 LAB — LDL CHOLESTEROL, DIRECT: Direct LDL: 177.1 mg/dL

## 2013-02-08 ENCOUNTER — Ambulatory Visit: Payer: 59 | Admitting: Internal Medicine

## 2013-02-15 ENCOUNTER — Ambulatory Visit (INDEPENDENT_AMBULATORY_CARE_PROVIDER_SITE_OTHER): Payer: 59 | Admitting: Internal Medicine

## 2013-02-15 ENCOUNTER — Encounter: Payer: Self-pay | Admitting: Internal Medicine

## 2013-02-15 VITALS — BP 110/70 | HR 72 | Temp 98.2°F | Resp 16 | Ht 64.5 in | Wt 144.0 lb

## 2013-02-15 DIAGNOSIS — K589 Irritable bowel syndrome without diarrhea: Secondary | ICD-10-CM

## 2013-02-15 DIAGNOSIS — E785 Hyperlipidemia, unspecified: Secondary | ICD-10-CM

## 2013-02-15 DIAGNOSIS — D649 Anemia, unspecified: Secondary | ICD-10-CM

## 2013-02-15 NOTE — Progress Notes (Signed)
Pre visit review using our clinic review tool, if applicable. No additional management support is needed unless otherwise documented below in the visit note. 

## 2013-02-15 NOTE — Progress Notes (Signed)
Subjective:    Patient ID: Angela Robbins, female    DOB: 1950-11-22, 63 y.o.   MRN: [id number]  HPI Follow up for IBS Hyperlipidemia Hx of iron def anemia Increased problems with hemorrhoids and fissure and has seen Dr Carlean Purl. Next colon 10 years Worried about "leakage" Pain is "ok" concerned about anemia!   Review of Systems  Constitutional: Negative for activity change, appetite change and fatigue.  HENT: Negative for congestion, ear pain, postnasal drip and sinus pressure.   Eyes: Negative for redness and visual disturbance.  Respiratory: Negative for cough, shortness of breath and wheezing.   Gastrointestinal: Positive for blood in stool, anal bleeding and rectal pain. Negative for abdominal pain and abdominal distention.  Genitourinary: Negative for dysuria, frequency and menstrual problem.  Musculoskeletal: Negative for arthralgias, joint swelling, myalgias and neck pain.  Skin: Negative for rash and wound.  Neurological: Negative for dizziness, weakness and headaches.  Hematological: Negative for adenopathy. Does not bruise/bleed easily.  Psychiatric/Behavioral: Negative for sleep disturbance and decreased concentration.   Past Medical History  Diagnosis Date  . Allergy   . Hyperlipidemia   . Tubular adenoma 08/26/2007  . Anal fissure 09/27/2012    History   Social History  . Marital Status: Married    Spouse Name: N/A    Number of Children: N/A  . Years of Education: N/A   Occupational History  . Not on file.   Social History Main Topics  . Smoking status: Never Smoker   . Smokeless tobacco: Never Used  . Alcohol Use: No  . Drug Use: No  . Sexual Activity: Yes   Other Topics Concern  . Not on file   Social History Narrative   Married   Retired   1 son and 1 daughter    Past Surgical History  Procedure Laterality Date  . Colonoscopy  08/26/2007    Family History  Problem Relation Age of Onset  . Colon cancer Neg Hx     Allergies    Allergen Reactions  . Prednisone Other (See Comments)    Nervous system reactions, anxious    Current Outpatient Prescriptions on File Prior to Visit  Medication Sig Dispense Refill  . AMBULATORY NON FORMULARY MEDICATION Medication Name: Nitroglycerine ointment 0.125 %  Apply a pea sized amount internally two to three times a day Dispense 30 GM zero refill  1 Tube  1  . BIOTIN 5000 PO Take 1 tablet by mouth daily.      . Cholecalciferol (VITAMIN D3) 1000 UNITS CAPS Take 1 capsule by mouth daily.      Marland Kitchen co-enzyme Q-10 30 MG capsule Take 30 mg by mouth 3 (three) times daily.        . magnesium 30 MG tablet Take 30 mg by mouth 2 (two) times daily.        . NON FORMULARY 1 tablet. kyolic acid and garlic      . polycarbophil (FIBERCON) 625 MG tablet Take by mouth daily. 3 caps per day       . Probiotic Product (ALIGN) 4 MG CAPS Take 1 capsule by mouth daily.      . vitamin C (ASCORBIC ACID) 500 MG tablet Take 500 mg by mouth daily.      . [DISCONTINUED] Azelastine HCl (ASTEPRO) 0.15 % SOLN Place 1 spray into the nose 2 (two) times daily.  30 mL  3   No current facility-administered medications on file prior to visit.    BP 110/70  Pulse 72  Temp(Src) 98.2 F (36.8 C)  Resp 16  Ht 5' 4.5" (1.638 m)  Wt 144 lb (65.318 kg)  BMI 24.34 kg/m2       Objective:   Physical Exam  Nursing note and vitals reviewed. Constitutional: She is oriented to person, place, and time. She appears well-developed and well-nourished. No distress.  HENT:  Head: Normocephalic and atraumatic.  Eyes: Conjunctivae and EOM are normal. Pupils are equal, round, and reactive to light.  Neck: Normal range of motion. Neck supple. No JVD present. No tracheal deviation present. No thyromegaly present.  Cardiovascular: Normal rate and regular rhythm.   Murmur heard. Pulmonary/Chest: Effort normal and breath sounds normal. She has no wheezes. She exhibits no tenderness.  Abdominal: Soft. Bowel sounds are normal.   Musculoskeletal: Normal range of motion. She exhibits no edema and no tenderness.  Lymphadenopathy:    She has no cervical adenopathy.  Neurological: She is alert and oriented to person, place, and time. She has normal reflexes. No cranial nerve deficit.  Skin: Skin is warm and dry. She is not diaphoretic.  Psychiatric: She has a normal mood and affect. Her behavior is normal.          Assessment & Plan:  IBS as a factor of the hemorrhoids and fissure bleeding  I have spent more than 30 minutes examining this patient face-to-face of which over half was spent in counseling Needs change probiotic Increase fiber to BID

## 2013-09-01 ENCOUNTER — Other Ambulatory Visit: Payer: 59

## 2013-09-08 ENCOUNTER — Encounter: Payer: 59 | Admitting: Internal Medicine

## 2013-10-10 ENCOUNTER — Other Ambulatory Visit: Payer: Self-pay

## 2013-10-10 DIAGNOSIS — Z1231 Encounter for screening mammogram for malignant neoplasm of breast: Secondary | ICD-10-CM

## 2013-10-11 ENCOUNTER — Other Ambulatory Visit: Payer: Self-pay | Admitting: Obstetrics & Gynecology

## 2013-10-12 LAB — CYTOLOGY - PAP

## 2013-10-26 ENCOUNTER — Ambulatory Visit (INDEPENDENT_AMBULATORY_CARE_PROVIDER_SITE_OTHER): Payer: 59

## 2013-10-26 ENCOUNTER — Ambulatory Visit
Admission: RE | Admit: 2013-10-26 | Discharge: 2013-10-26 | Disposition: A | Discharge status: Home / Self Care | Payer: 59 | Source: Ambulatory Visit

## 2013-10-26 DIAGNOSIS — Z1231 Encounter for screening mammogram for malignant neoplasm of breast: Secondary | ICD-10-CM

## 2013-10-26 DIAGNOSIS — Z23 Encounter for immunization: Secondary | ICD-10-CM

## 2013-12-07 ENCOUNTER — Ambulatory Visit (INDEPENDENT_AMBULATORY_CARE_PROVIDER_SITE_OTHER): Payer: 59 | Admitting: Internal Medicine

## 2013-12-07 ENCOUNTER — Encounter: Payer: Self-pay | Admitting: Internal Medicine

## 2013-12-07 ENCOUNTER — Other Ambulatory Visit (INDEPENDENT_AMBULATORY_CARE_PROVIDER_SITE_OTHER): Payer: 59

## 2013-12-07 VITALS — BP 118/78 | HR 56 | Temp 98.4°F | Resp 12 | Ht 64.0 in | Wt 143.6 lb

## 2013-12-07 DIAGNOSIS — D509 Iron deficiency anemia, unspecified: Secondary | ICD-10-CM

## 2013-12-07 DIAGNOSIS — Z0000 Encounter for general adult medical examination without abnormal findings: Secondary | ICD-10-CM

## 2013-12-07 DIAGNOSIS — E785 Hyperlipidemia, unspecified: Secondary | ICD-10-CM

## 2013-12-07 DIAGNOSIS — K589 Irritable bowel syndrome without diarrhea: Secondary | ICD-10-CM

## 2013-12-07 LAB — BASIC METABOLIC PANEL
BUN: 13 mg/dL (ref 6–23)
CHLORIDE: 105 meq/L (ref 96–112)
CO2: 27 mEq/L (ref 19–32)
CREATININE: 0.8 mg/dL (ref 0.4–1.2)
Calcium: 9.2 mg/dL (ref 8.4–10.5)
GFR: 74.6 mL/min (ref >60.00)
GLUCOSE: 82 mg/dL (ref 70–99)
POTASSIUM: 4 meq/L (ref 3.5–5.1)
Sodium: 140 mEq/L (ref 135–145)

## 2013-12-07 LAB — LIPID PANEL
CHOL/HDL RATIO: 4
Cholesterol: 256 mg/dL — ABNORMAL HIGH (ref 0–200)
HDL: 70.5 mg/dL (ref >39.00)
LDL Cholesterol: 165 mg/dL — ABNORMAL HIGH (ref 0–99)
NONHDL: 185.5
Triglycerides: 101 mg/dL (ref 0.0–149.0)
VLDL: 20.2 mg/dL (ref 0.0–40.0)

## 2013-12-07 LAB — CBC
HCT: 36.1 % (ref 36.0–46.0)
Hemoglobin: 12.4 g/dL (ref 12.0–15.0)
MCHC: 34.4 g/dL (ref 30.0–36.0)
MCV: 89.3 fl (ref 78.0–100.0)
Platelets: 209 10*3/uL (ref 150.0–400.0)
RBC: 4.04 Mil/uL (ref 3.87–5.11)
RDW: 13.6 % (ref 11.5–15.5)
WBC: 3.7 10*3/uL — AB (ref 4.0–10.5)

## 2013-12-07 NOTE — Patient Instructions (Signed)
We will check your blood work today. Come back in about 1 year for another physical.  If you have any problems or questions before your next visit please feel free to call our office.  Health Maintenance Adopting a healthy lifestyle and getting preventive care can go a long way to promote health and wellness. Talk with your health care provider about what schedule of regular examinations is right for you. This is a good chance for you to check in with your provider about disease prevention and staying healthy. In between checkups, there are plenty of things you can do on your own. Experts have done a lot of research about which lifestyle changes and preventive measures are most likely to keep you healthy. Ask your health care provider for more information. WEIGHT AND DIET  Eat a healthy diet  Be sure to include plenty of vegetables, fruits, low-fat dairy products, and lean protein.  Do not eat a lot of foods high in solid fats, added sugars, or salt.  Get regular exercise. This is one of the most important things you can do for your health.  Most adults should exercise for at least 150 minutes each week. The exercise should increase your heart rate and make you sweat (moderate-intensity exercise).  Most adults should also do strengthening exercises at least twice a week. This is in addition to the moderate-intensity exercise.  Maintain a healthy weight  Body mass index (BMI) is a measurement that can be used to identify possible weight problems. It estimates body fat based on height and weight. Your health care provider can help determine your BMI and help you achieve or maintain a healthy weight.  For females 60 years of age and older:   A BMI below 18.5 is considered underweight.  A BMI of 18.5 to 24.9 is normal.  A BMI of 25 to 29.9 is considered overweight.  A BMI of 30 and above is considered obese.  Watch levels of cholesterol and blood lipids  You should start having your  blood tested for lipids and cholesterol at 64 years of age, then have this test every 5 years.  You may need to have your cholesterol levels checked more often if:  Your lipid or cholesterol levels are high.  You are older than 64 years of age.  You are at high risk for heart disease.  CANCER SCREENING   Lung Cancer  Lung cancer screening is recommended for adults 21-1 years old who are at high risk for lung cancer because of a history of smoking.  A yearly low-dose CT scan of the lungs is recommended for people who:  Currently smoke.  Have quit within the past 15 years.  Have at least a 30-pack-year history of smoking. A pack year is smoking an average of one pack of cigarettes a day for 1 year.  Yearly screening should continue until it has been 15 years since you quit.  Yearly screening should stop if you develop a health problem that would prevent you from having lung cancer treatment.  Breast Cancer  Practice breast self-awareness. This means understanding how your breasts normally appear and feel.  It also means doing regular breast self-exams. Let your health care provider know about any changes, no matter how small.  If you are in your 20s or 30s, you should have a clinical breast exam (CBE) by a health care provider every 1-3 years as part of a regular health exam.  If you are 40 or older, have  a CBE every year. Also consider having a breast X-ray (mammogram) every year.  If you have a family history of breast cancer, talk to your health care provider about genetic screening.  If you are at high risk for breast cancer, talk to your health care provider about having an MRI and a mammogram every year.  Breast cancer gene (BRCA) assessment is recommended for women who have family members with BRCA-related cancers. BRCA-related cancers include:  Breast.  Ovarian.  Tubal.  Peritoneal cancers.  Results of the assessment will determine the need for genetic  counseling and BRCA1 and BRCA2 testing. Cervical Cancer Routine pelvic examinations to screen for cervical cancer are no longer recommended for nonpregnant women who are considered low risk for cancer of the pelvic organs (ovaries, uterus, and vagina) and who do not have symptoms. A pelvic examination may be necessary if you have symptoms including those associated with pelvic infections. Ask your health care provider if a screening pelvic exam is right for you.   The Pap test is the screening test for cervical cancer for women who are considered at risk.  If you had a hysterectomy for a problem that was not cancer or a condition that could lead to cancer, then you no longer need Pap tests.  If you are older than 65 years, and you have had normal Pap tests for the past 10 years, you no longer need to have Pap tests.  If you have had past treatment for cervical cancer or a condition that could lead to cancer, you need Pap tests and screening for cancer for at least 20 years after your treatment.  If you no longer get a Pap test, assess your risk factors if they change (such as having a new sexual partner). This can affect whether you should start being screened again.  Some women have medical problems that increase their chance of getting cervical cancer. If this is the case for you, your health care provider may recommend more frequent screening and Pap tests.  The human papillomavirus (HPV) test is another test that may be used for cervical cancer screening. The HPV test looks for the virus that can cause cell changes in the cervix. The cells collected during the Pap test can be tested for HPV.  The HPV test can be used to screen women 26 years of age and older. Getting tested for HPV can extend the interval between normal Pap tests from three to five years.  An HPV test also should be used to screen women of any age who have unclear Pap test results.  After 64 years of age, women should have  HPV testing as often as Pap tests.  Colorectal Cancer  This type of cancer can be detected and often prevented.  Routine colorectal cancer screening usually begins at 64 years of age and continues through 64 years of age.  Your health care provider may recommend screening at an earlier age if you have risk factors for colon cancer.  Your health care provider may also recommend using home test kits to check for hidden blood in the stool.  A small camera at the end of a tube can be used to examine your colon directly (sigmoidoscopy or colonoscopy). This is done to check for the earliest forms of colorectal cancer.  Routine screening usually begins at age 84.  Direct examination of the colon should be repeated every 5-10 years through 64 years of age. However, you may need to be screened more  often if early forms of precancerous polyps or small growths are found. Skin Cancer  Check your skin from head to toe regularly.  Tell your health care provider about any new moles or changes in moles, especially if there is a change in a mole's shape or color.  Also tell your health care provider if you have a mole that is larger than the size of a pencil eraser.  Always use sunscreen. Apply sunscreen liberally and repeatedly throughout the day.  Protect yourself by wearing long sleeves, pants, a wide-brimmed hat, and sunglasses whenever you are outside. HEART DISEASE, DIABETES, AND HIGH BLOOD PRESSURE   Have your blood pressure checked at least every 1-2 years. High blood pressure causes heart disease and increases the risk of stroke.  If you are between 33 years and 45 years old, ask your health care provider if you should take aspirin to prevent strokes.  Have regular diabetes screenings. This involves taking a blood sample to check your fasting blood sugar level.  If you are at a normal weight and have a low risk for diabetes, have this test once every three years after 64 years of  age.  If you are overweight and have a high risk for diabetes, consider being tested at a younger age or more often. PREVENTING INFECTION  Hepatitis B  If you have a higher risk for hepatitis B, you should be screened for this virus. You are considered at high risk for hepatitis B if:  You were born in a country where hepatitis B is common. Ask your health care provider which countries are considered high risk.  Your parents were born in a high-risk country, and you have not been immunized against hepatitis B (hepatitis B vaccine).  You have HIV or AIDS.  You use needles to inject street drugs.  You live with someone who has hepatitis B.  You have had sex with someone who has hepatitis B.  You get hemodialysis treatment.  You take certain medicines for conditions, including cancer, organ transplantation, and autoimmune conditions. Hepatitis C  Blood testing is recommended for:  Everyone born from [birth year] through 1966.  Anyone with known risk factors for hepatitis C. Sexually transmitted infections (STIs)  You should be screened for sexually transmitted infections (STIs) including gonorrhea and chlamydia if:  You are sexually active and are younger than 64 years of age.  You are older than 64 years of age and your health care provider tells you that you are at risk for this type of infection.  Your sexual activity has changed since you were last screened and you are at an increased risk for chlamydia or gonorrhea. Ask your health care provider if you are at risk.  If you do not have HIV, but are at risk, it may be recommended that you take a prescription medicine daily to prevent HIV infection. This is called pre-exposure prophylaxis (PrEP). You are considered at risk if:  You are sexually active and do not regularly use condoms or know the HIV status of your partner(s).  You take drugs by injection.  You are sexually active with a partner who has HIV. Talk with your health  care provider about whether you are at high risk of being infected with HIV. If you choose to begin PrEP, you should first be tested for HIV. You should then be tested every 3 months for as long as you are taking PrEP.  PREGNANCY   If you are premenopausal and you may become  pregnant, ask your health care provider about preconception counseling.  If you may become pregnant, take 400 to 800 micrograms (mcg) of folic acid every day.  If you want to prevent pregnancy, talk to your health care provider about birth control (contraception). OSTEOPOROSIS AND MENOPAUSE   Osteoporosis is a disease in which the bones lose minerals and strength with aging. This can result in serious bone fractures. Your risk for osteoporosis can be identified using a bone density scan.  If you are 42 years of age or older, or if you are at risk for osteoporosis and fractures, ask your health care provider if you should be screened.  Ask your health care provider whether you should take a calcium or vitamin D supplement to lower your risk for osteoporosis.  Menopause may have certain physical symptoms and risks.  Hormone replacement therapy may reduce some of these symptoms and risks. Talk to your health care provider about whether hormone replacement therapy is right for you.  HOME CARE INSTRUCTIONS   Schedule regular health, dental, and eye exams.  Stay current with your immunizations.   Do not use any tobacco products including cigarettes, chewing tobacco, or electronic cigarettes.  If you are pregnant, do not drink alcohol.  If you are breastfeeding, limit how much and how often you drink alcohol.  Limit alcohol intake to no more than 1 drink per day for nonpregnant women. One drink equals 12 ounces of beer, 5 ounces of wine, or 1 ounces of hard liquor.  Do not use street drugs.  Do not share needles.  Ask your health care provider for help if you need support or information about quitting  drugs.  Tell your health care provider if you often feel depressed.  Tell your health care provider if you have ever been abused or do not feel safe at home. Document Released: 07/21/2010 Document Revised: 05/22/2013 Document Reviewed: 12/07/2012 [facility] Patient Information 2015 [place], [place]. This information is not intended to replace advice given to you by your health care provider. Make sure you discuss any questions you have with your health care provider.

## 2013-12-08 DIAGNOSIS — Z0000 Encounter for general adult medical examination without abnormal findings: Secondary | ICD-10-CM | POA: Insufficient documentation

## 2013-12-08 NOTE — Progress Notes (Signed)
   Subjective:    Patient ID: Aniylah Avans Robbins, female    DOB: [birth year], 63 y.o.   MRN: [id number]  HPI The patient is a 63 year old female who comes in today to establish care. She has past medical history of iron deficiency, IBS, anal fissure, hemorrhoids. She is doing very well overall and has not had much trouble with her hemorrhoids lately. She has been keeping herself moving regularly via dietary modifications. This is helped a lot with her hemorrhoids. She denies chest pains, shortness of breath, joint pain and swelling, balance problems, falls.  Review of Systems  Constitutional: Negative for fever, activity change, appetite change, fatigue and unexpected weight change.  HENT: Negative.   Respiratory: Negative for cough, chest tightness, shortness of breath and wheezing.   Cardiovascular: Negative for chest pain, palpitations and leg swelling.  Gastrointestinal: Negative for abdominal pain, diarrhea, constipation, blood in stool and abdominal distention.  Musculoskeletal: Negative.   Skin: Negative.   Neurological: Negative for dizziness, weakness, light-headedness and headaches.  Psychiatric/Behavioral: Negative.       Objective:   Physical Exam  Constitutional: She is oriented to person, place, and time. She appears well-developed and well-nourished.  HENT:  Head: Normocephalic and atraumatic.  Eyes: EOM are normal.  Neck: Normal range of motion.  Cardiovascular: Normal rate and regular rhythm.   Pulmonary/Chest: Effort normal and breath sounds normal. No respiratory distress. She has no wheezes. She has no rales.  Abdominal: Soft. Bowel sounds are normal. She exhibits no distension. There is no tenderness. There is no rebound.  Neurological: She is alert and oriented to person, place, and time. Coordination normal.  Skin: Skin is warm and dry.   Filed Vitals:   12/07/13 1048  BP: 118/78  Pulse: 56  Temp: 98.4 F (36.9 C)  TempSrc: Oral  Resp: 12  Height: 5\' 4"   (1.626 m)  Weight: 143 lb 9.6 oz (65.137 kg)  SpO2: 94%      Assessment & Plan:

## 2013-12-08 NOTE — Assessment & Plan Note (Signed)
Doing well with OTC regimens as well as probiotics daily.

## 2013-12-08 NOTE — Assessment & Plan Note (Signed)
History of colonic polyps, due colonoscopy every 5 years. She is up-to-date on all immunizations. Spoke with her about healthy diet and exercise. She denies any falls.

## 2013-12-08 NOTE — Assessment & Plan Note (Signed)
Not currently taking iron at this time, will check CBC today. Is up-to-date on colonoscopy, denies vaginal bleeding.

## 2013-12-08 NOTE — Assessment & Plan Note (Signed)
Check lipid panel today, not currently taking statin.

## 2014-10-15 ENCOUNTER — Other Ambulatory Visit: Payer: Self-pay | Admitting: Obstetrics & Gynecology

## 2014-10-16 LAB — CYTOLOGY - PAP

## 2014-10-29 ENCOUNTER — Ambulatory Visit (INDEPENDENT_AMBULATORY_CARE_PROVIDER_SITE_OTHER): Payer: 59 | Admitting: Internal Medicine

## 2014-10-29 ENCOUNTER — Other Ambulatory Visit (INDEPENDENT_AMBULATORY_CARE_PROVIDER_SITE_OTHER): Payer: 59

## 2014-10-29 ENCOUNTER — Encounter: Payer: Self-pay | Admitting: Internal Medicine

## 2014-10-29 VITALS — BP 120/72 | HR 56 | Temp 98.1°F | Resp 16 | Ht 64.5 in | Wt 149.1 lb

## 2014-10-29 DIAGNOSIS — Z0000 Encounter for general adult medical examination without abnormal findings: Secondary | ICD-10-CM

## 2014-10-29 DIAGNOSIS — Z23 Encounter for immunization: Secondary | ICD-10-CM

## 2014-10-29 DIAGNOSIS — E785 Hyperlipidemia, unspecified: Secondary | ICD-10-CM | POA: Diagnosis not present

## 2014-10-29 LAB — COMPREHENSIVE METABOLIC PANEL
ALT: 13 U/L (ref 0–35)
AST: 21 U/L (ref 0–37)
Albumin: 4.3 g/dL (ref 3.5–5.2)
Alkaline Phosphatase: 55 U/L (ref 39–117)
BILIRUBIN TOTAL: 0.6 mg/dL (ref 0.2–1.2)
BUN: 12 mg/dL (ref 6–23)
CO2: 30 meq/L (ref 19–32)
Calcium: 9.6 mg/dL (ref 8.4–10.5)
Chloride: 103 mEq/L (ref 96–112)
Creatinine, Ser: 0.76 mg/dL (ref 0.40–1.20)
GFR: 81.21 mL/min (ref >60.00)
GLUCOSE: 94 mg/dL (ref 70–99)
POTASSIUM: 4.3 meq/L (ref 3.5–5.1)
SODIUM: 140 meq/L (ref 135–145)
Total Protein: 7.3 g/dL (ref 6.0–8.3)

## 2014-10-29 LAB — LIPID PANEL
CHOL/HDL RATIO: 4
Cholesterol: 253 mg/dL — ABNORMAL HIGH (ref 0–200)
HDL: 70.5 mg/dL (ref >39.00)
LDL Cholesterol: 160 mg/dL — ABNORMAL HIGH (ref 0–99)
NONHDL: 182.34
Triglycerides: 110 mg/dL (ref 0.0–149.0)
VLDL: 22 mg/dL (ref 0.0–40.0)

## 2014-10-29 LAB — CBC
HCT: 37.9 % (ref 36.0–46.0)
Hemoglobin: 12.9 g/dL (ref 12.0–15.0)
MCHC: 34.1 g/dL (ref 30.0–36.0)
MCV: 89.4 fl (ref 78.0–100.0)
Platelets: 221 10*3/uL (ref 150.0–400.0)
RBC: 4.24 Mil/uL (ref 3.87–5.11)
RDW: 13.1 % (ref 11.5–15.5)
WBC: 4 10*3/uL (ref 4.0–10.5)

## 2014-10-29 NOTE — Assessment & Plan Note (Signed)
Checking lipid panel today and adjust as needed. Not currently on medication.

## 2014-10-29 NOTE — Patient Instructions (Signed)
We have given you the flu shot and the pneumonia shot.   We will check the blood work and call you back with the results.   Keep up the good work with your health.   Health Maintenance, Female Adopting a healthy lifestyle and getting preventive care can go a long way to promote health and wellness. Talk with your health care provider about what schedule of regular examinations is right for you. This is a good chance for you to check in with your provider about disease prevention and staying healthy. In between checkups, there are plenty of things you can do on your own. Experts have done a lot of research about which lifestyle changes and preventive measures are most likely to keep you healthy. Ask your health care provider for more information. WEIGHT AND DIET  Eat a healthy diet  Be sure to include plenty of vegetables, fruits, low-fat dairy products, and lean protein.  Do not eat a lot of foods high in solid fats, added sugars, or salt.  Get regular exercise. This is one of the most important things you can do for your health.  Most adults should exercise for at least 150 minutes each week. The exercise should increase your heart rate and make you sweat (moderate-intensity exercise).  Most adults should also do strengthening exercises at least twice a week. This is in addition to the moderate-intensity exercise.  Maintain a healthy weight  Body mass index (BMI) is a measurement that can be used to identify possible weight problems. It estimates body fat based on height and weight. Your health care provider can help determine your BMI and help you achieve or maintain a healthy weight.  For females 26 years of age and older:   A BMI below 18.5 is considered underweight.  A BMI of 18.5 to 24.9 is normal.  A BMI of 25 to 29.9 is considered overweight.  A BMI of 30 and above is considered obese.  Watch levels of cholesterol and blood lipids  You should start having your blood  tested for lipids and cholesterol at 65 years of age, then have this test every 5 years.  You may need to have your cholesterol levels checked more often if:  Your lipid or cholesterol levels are high.  You are older than 65 years of age.  You are at high risk for heart disease.  CANCER SCREENING   Lung Cancer  Lung cancer screening is recommended for adults 70-65 years old who are at high risk for lung cancer because of a history of smoking.  A yearly low-dose CT scan of the lungs is recommended for people who:  Currently smoke.  Have quit within the past 15 years.  Have at least a 30-pack-year history of smoking. A pack year is smoking an average of one pack of cigarettes a day for 1 year.  Yearly screening should continue until it has been 15 years since you quit.  Yearly screening should stop if you develop a health problem that would prevent you from having lung cancer treatment.  Breast Cancer  Practice breast self-awareness. This means understanding how your breasts normally appear and feel.  It also means doing regular breast self-exams. Let your health care provider know about any changes, no matter how small.  If you are in your 20s or 30s, you should have a clinical breast exam (CBE) by a health care provider every 1-3 years as part of a regular health exam.  If you are 40  or older, have a CBE every year. Also consider having a breast X-ray (mammogram) every year.  If you have a family history of breast cancer, talk to your health care provider about genetic screening.  If you are at high risk for breast cancer, talk to your health care provider about having an MRI and a mammogram every year.  Breast cancer gene (BRCA) assessment is recommended for women who have family members with BRCA-related cancers. BRCA-related cancers include:  Breast.  Ovarian.  Tubal.  Peritoneal cancers.  Results of the assessment will determine the need for genetic counseling  and BRCA1 and BRCA2 testing. Cervical Cancer Your health care provider may recommend that you be screened regularly for cancer of the pelvic organs (ovaries, uterus, and vagina). This screening involves a pelvic examination, including checking for microscopic changes to the surface of your cervix (Pap test). You may be encouraged to have this screening done every 3 years, beginning at age 48.  For women ages 28-65, health care providers may recommend pelvic exams and Pap testing every 3 years, or they may recommend the Pap and pelvic exam, combined with testing for human papilloma virus (HPV), every 5 years. Some types of HPV increase your risk of cervical cancer. Testing for HPV may also be done on women of any age with unclear Pap test results.  Other health care providers may not recommend any screening for nonpregnant women who are considered low risk for pelvic cancer and who do not have symptoms. Ask your health care provider if a screening pelvic exam is right for you.  If you have had past treatment for cervical cancer or a condition that could lead to cancer, you need Pap tests and screening for cancer for at least 20 years after your treatment. If Pap tests have been discontinued, your risk factors (such as having a new sexual partner) need to be reassessed to determine if screening should resume. Some women have medical problems that increase the chance of getting cervical cancer. In these cases, your health care provider may recommend more frequent screening and Pap tests. Colorectal Cancer  This type of cancer can be detected and often prevented.  Routine colorectal cancer screening usually begins at 65 years of age and continues through 65 years of age.  Your health care provider may recommend screening at an earlier age if you have risk factors for colon cancer.  Your health care provider may also recommend using home test kits to check for hidden blood in the stool.  A small camera  at the end of a tube can be used to examine your colon directly (sigmoidoscopy or colonoscopy). This is done to check for the earliest forms of colorectal cancer.  Routine screening usually begins at age 75.  Direct examination of the colon should be repeated every 5-10 years through 65 years of age. However, you may need to be screened more often if early forms of precancerous polyps or small growths are found. Skin Cancer  Check your skin from head to toe regularly.  Tell your health care provider about any new moles or changes in moles, especially if there is a change in a mole's shape or color.  Also tell your health care provider if you have a mole that is larger than the size of a pencil eraser.  Always use sunscreen. Apply sunscreen liberally and repeatedly throughout the day.  Protect yourself by wearing long sleeves, pants, a wide-brimmed hat, and sunglasses whenever you are outside. HEART DISEASE,  DIABETES, AND HIGH BLOOD PRESSURE   High blood pressure causes heart disease and increases the risk of stroke. High blood pressure is more likely to develop in:  People who have blood pressure in the high end of the normal range (130-139/85-89 mm Hg).  People who are overweight or obese.  People who are African American.  If you are 18-39 years of age, have your blood pressure checked every 3-5 years. If you are 40 years of age or older, have your blood pressure checked every year. You should have your blood pressure measured twice--once when you are at a hospital or clinic, and once when you are not at a hospital or clinic. Record the average of the two measurements. To check your blood pressure when you are not at a hospital or clinic, you can use:  An automated blood pressure machine at a pharmacy.  A home blood pressure monitor.  If you are between 55 years and 79 years old, ask your health care provider if you should take aspirin to prevent strokes.  Have regular diabetes  screenings. This involves taking a blood sample to check your fasting blood sugar level.  If you are at a normal weight and have a low risk for diabetes, have this test once every three years after 65 years of age.  If you are overweight and have a high risk for diabetes, consider being tested at a younger age or more often. PREVENTING INFECTION  Hepatitis B  If you have a higher risk for hepatitis B, you should be screened for this virus. You are considered at high risk for hepatitis B if:  You were born in a country where hepatitis B is common. Ask your health care provider which countries are considered high risk.  Your parents were born in a high-risk country, and you have not been immunized against hepatitis B (hepatitis B vaccine).  You have HIV or AIDS.  You use needles to inject street drugs.  You live with someone who has hepatitis B.  You have had sex with someone who has hepatitis B.  You get hemodialysis treatment.  You take certain medicines for conditions, including cancer, organ transplantation, and autoimmune conditions. Hepatitis C  Blood testing is recommended for:  Everyone born from 1945 through 1965.  Anyone with known risk factors for hepatitis C. Sexually transmitted infections (STIs)  You should be screened for sexually transmitted infections (STIs) including gonorrhea and chlamydia if:  You are sexually active and are younger than 65 years of age.  You are older than 65 years of age and your health care provider tells you that you are at risk for this type of infection.  Your sexual activity has changed since you were last screened and you are at an increased risk for chlamydia or gonorrhea. Ask your health care provider if you are at risk.  If you do not have HIV, but are at risk, it may be recommended that you take a prescription medicine daily to prevent HIV infection. This is called pre-exposure prophylaxis (PrEP). You are considered at risk  if:  You are sexually active and do not regularly use condoms or know the HIV status of your partner(s).  You take drugs by injection.  You are sexually active with a partner who has HIV. Talk with your health care provider about whether you are at high risk of being infected with HIV. If you choose to begin PrEP, you should first be tested for HIV. You should then   be tested every 3 months for as long as you are taking PrEP.  PREGNANCY   If you are premenopausal and you may become pregnant, ask your health care provider about preconception counseling.  If you may become pregnant, take 400 to 800 micrograms (mcg) of folic acid every day.  If you want to prevent pregnancy, talk to your health care provider about birth control (contraception). OSTEOPOROSIS AND MENOPAUSE   Osteoporosis is a disease in which the bones lose minerals and strength with aging. This can result in serious bone fractures. Your risk for osteoporosis can be identified using a bone density scan.  If you are 80 years of age or older, or if you are at risk for osteoporosis and fractures, ask your health care provider if you should be screened.  Ask your health care provider whether you should take a calcium or vitamin D supplement to lower your risk for osteoporosis.  Menopause may have certain physical symptoms and risks.  Hormone replacement therapy may reduce some of these symptoms and risks. Talk to your health care provider about whether hormone replacement therapy is right for you.  HOME CARE INSTRUCTIONS   Schedule regular health, dental, and eye exams.  Stay current with your immunizations.   Do not use any tobacco products including cigarettes, chewing tobacco, or electronic cigarettes.  If you are pregnant, do not drink alcohol.  If you are breastfeeding, limit how much and how often you drink alcohol.  Limit alcohol intake to no more than 1 drink per day for nonpregnant women. One drink equals 12  ounces of beer, 5 ounces of wine, or 1 ounces of hard liquor.  Do not use street drugs.  Do not share needles.  Ask your health care provider for help if you need support or information about quitting drugs.  Tell your health care provider if you often feel depressed.  Tell your health care provider if you have ever been abused or do not feel safe at home.   This information is not intended to replace advice given to you by your health care provider. Make sure you discuss any questions you have with your health care provider.   Document Released: 07/21/2010 Document Revised: 01/26/2014 Document Reviewed: 12/07/2012 Elsevier Interactive Patient Education [employer].

## 2014-10-29 NOTE — Assessment & Plan Note (Signed)
Colonoscopy up to date, tetanus up to date. Given flu shot and pneumonia 23 at visit. Shingles completed. Talked to her about regular exercise and sun safety. No problems with falls or depression.

## 2014-10-29 NOTE — Progress Notes (Signed)
   Subjective:    Patient ID: Angela Robbins, female    DOB: [birth year], 64 y.o.   MRN: [id number]  HPI The patient is a 64 YO female here for wellness. No new complaints. No new health problems.   PMH, [facility], social history reviewed and updated.   Review of Systems  Constitutional: Negative for fever, activity change, appetite change, fatigue and unexpected weight change.  HENT: Negative.   Eyes: Negative.   Respiratory: Negative for cough, chest tightness, shortness of breath and wheezing.   Cardiovascular: Negative for chest pain, palpitations and leg swelling.  Gastrointestinal: Negative for abdominal pain, diarrhea, constipation, blood in stool and abdominal distention.  Musculoskeletal: Negative.   Skin: Negative.   Neurological: Negative for dizziness, weakness, light-headedness and headaches.  Psychiatric/Behavioral: Negative.       Objective:   Physical Exam  Constitutional: She is oriented to person, place, and time. She appears well-developed and well-nourished.  HENT:  Head: Normocephalic and atraumatic.  Eyes: EOM are normal.  Neck: Normal range of motion.  Cardiovascular: Normal rate and regular rhythm.   Pulmonary/Chest: Effort normal and breath sounds normal. No respiratory distress. She has no wheezes. She has no rales.  Abdominal: Soft. Bowel sounds are normal. She exhibits no distension. There is no tenderness. There is no rebound.  Neurological: She is alert and oriented to person, place, and time. Coordination normal.  Skin: Skin is warm and dry.  Psychiatric: She has a normal mood and affect.   Filed Vitals:   10/29/14 0852  BP: 120/72  Pulse: 56  Temp: 98.1 F (36.7 C)  TempSrc: Oral  Resp: 16  Height: 5' 4.5" (1.638 m)  Weight: 149 lb 1.9 oz (67.64 kg)  SpO2: 98%      Assessment & Plan:  Flu and pneumonia 23 shot given at visit.

## 2014-10-29 NOTE — Progress Notes (Signed)
Pre visit review using our clinic review tool, if applicable. No additional management support is needed unless otherwise documented below in the visit note. 

## 2014-11-01 ENCOUNTER — Encounter: Payer: Self-pay | Admitting: Internal Medicine

## 2015-01-23 ENCOUNTER — Other Ambulatory Visit: Payer: Self-pay

## 2015-01-23 DIAGNOSIS — Z1231 Encounter for screening mammogram for malignant neoplasm of breast: Secondary | ICD-10-CM

## 2015-02-20 ENCOUNTER — Ambulatory Visit
Admission: RE | Admit: 2015-02-20 | Discharge: 2015-02-20 | Disposition: A | Discharge status: Home / Self Care | Payer: Medicare Other | Source: Ambulatory Visit

## 2015-02-20 DIAGNOSIS — Z1231 Encounter for screening mammogram for malignant neoplasm of breast: Secondary | ICD-10-CM

## 2015-10-15 ENCOUNTER — Encounter: Payer: Self-pay | Admitting: Internal Medicine

## 2015-10-15 ENCOUNTER — Ambulatory Visit (INDEPENDENT_AMBULATORY_CARE_PROVIDER_SITE_OTHER): Payer: Medicare Other | Admitting: Internal Medicine

## 2015-10-15 ENCOUNTER — Other Ambulatory Visit (INDEPENDENT_AMBULATORY_CARE_PROVIDER_SITE_OTHER): Payer: Medicare Other

## 2015-10-15 VITALS — BP 130/70 | HR 57 | Temp 98.3°F | Resp 14 | Ht 64.5 in | Wt 151.0 lb

## 2015-10-15 DIAGNOSIS — Z0000 Encounter for general adult medical examination without abnormal findings: Secondary | ICD-10-CM | POA: Diagnosis not present

## 2015-10-15 DIAGNOSIS — Z1159 Encounter for screening for other viral diseases: Secondary | ICD-10-CM

## 2015-10-15 DIAGNOSIS — Z23 Encounter for immunization: Secondary | ICD-10-CM | POA: Diagnosis not present

## 2015-10-15 LAB — COMPREHENSIVE METABOLIC PANEL
ALK PHOS: 55 U/L (ref 39–117)
ALT: 13 U/L (ref 0–35)
AST: 23 U/L (ref 0–37)
Albumin: 4 g/dL (ref 3.5–5.2)
BILIRUBIN TOTAL: 0.6 mg/dL (ref 0.2–1.2)
BUN: 15 mg/dL (ref 6–23)
CALCIUM: 9.2 mg/dL (ref 8.4–10.5)
CO2: 32 mEq/L (ref 19–32)
Chloride: 105 mEq/L (ref 96–112)
Creatinine, Ser: 0.81 mg/dL (ref 0.40–1.20)
GFR: 75.23 mL/min (ref >60.00)
GLUCOSE: 90 mg/dL (ref 70–99)
POTASSIUM: 4.4 meq/L (ref 3.5–5.1)
Sodium: 142 mEq/L (ref 135–145)
TOTAL PROTEIN: 7.1 g/dL (ref 6.0–8.3)

## 2015-10-15 LAB — CBC
HCT: 36.4 % (ref 36.0–46.0)
Hemoglobin: 12.6 g/dL (ref 12.0–15.0)
MCHC: 34.6 g/dL (ref 30.0–36.0)
MCV: 88.2 fl (ref 78.0–100.0)
Platelets: 226 K/uL (ref 150.0–400.0)
RBC: 4.13 Mil/uL (ref 3.87–5.11)
RDW: 13.6 % (ref 11.5–15.5)
WBC: 4.5 K/uL (ref 4.0–10.5)

## 2015-10-15 LAB — LIPID PANEL
Cholesterol: 244 mg/dL — ABNORMAL HIGH (ref 0–200)
HDL: 72.4 mg/dL (ref >39.00)
LDL CALC: 153 mg/dL — AB (ref 0–99)
NONHDL: 171.46
Total CHOL/HDL Ratio: 3
Triglycerides: 92 mg/dL (ref 0.0–149.0)
VLDL: 18.4 mg/dL (ref 0.0–40.0)

## 2015-10-15 MED ORDER — HYDROCORTISONE 2.5 % RE CREA: 1.0000 "application " | TOPICAL_CREAM | Freq: Two times a day (BID) | RECTAL | 11 refills | Status: DC

## 2015-10-15 NOTE — Assessment & Plan Note (Signed)
Checking lipid panel and adjust as needed.  

## 2015-10-15 NOTE — Assessment & Plan Note (Signed)
Checking CBC and adjust as needed. Sent in anusol for her hemorrhoids and encouraged to see GI for possible banding.

## 2015-10-15 NOTE — Assessment & Plan Note (Signed)
Checking labs, Tdap and flu given at visit. Ordered bone density test. Checking labs including the hepatitis C screening test. Counseled about sun safety and mole surveillance. Given 10 year screening recommendations.

## 2015-10-15 NOTE — Progress Notes (Signed)
   Subjective:    Patient ID: Angela Robbins, female    DOB: Oct 26, 1950, 65 y.o.   MRN: [id number]  HPI Here for welcome to medicare wellness/CPE, no new complaints. Please see A/P for status and treatment of chronic medical problems.   Diet: heart healthy Physical activity: sedentary Depression/mood screen: negative Hearing: intact to whispered voice Visual acuity: grossly normal, performs annual eye exam  ADLs: capable Fall risk: none Home safety: good Cognitive evaluation: intact to orientation, naming, recall and repetition EOL planning: adv directives discussed  I have personally reviewed and have noted 1. The patient's medical and social history - reviewed today no changes 2. Their use of alcohol, tobacco or illicit drugs 3. Their current medications and supplements 4. The patient's functional ability including ADL's, fall risks, home safety risks and hearing or visual impairment. 5. Diet and physical activities 6. Evidence for depression or mood disorders 7. Care team reviewed and updated (available in snapshot)  Review of Systems  Constitutional: Negative for activity change, appetite change, fatigue, fever and unexpected weight change.  HENT: Negative.   Eyes: Negative.   Respiratory: Negative for cough, chest tightness, shortness of breath and wheezing.   Cardiovascular: Negative for chest pain, palpitations and leg swelling.  Gastrointestinal: Negative for abdominal distention, abdominal pain, blood in stool, constipation and diarrhea.  Musculoskeletal: Negative.   Skin: Negative.   Neurological: Negative for dizziness, weakness, light-headedness and headaches.  Psychiatric/Behavioral: Negative.       Objective:   Physical Exam  Constitutional: She is oriented to person, place, and time. She appears well-developed and well-nourished.  HENT:  Head: Normocephalic and atraumatic.  Eyes: EOM are normal.  Neck: Normal range of motion.  Cardiovascular: Normal rate  and regular rhythm.   Pulmonary/Chest: Effort normal and breath sounds normal. No respiratory distress. She has no wheezes. She has no rales.  Abdominal: Soft. Bowel sounds are normal. She exhibits no distension. There is no tenderness. There is no rebound.  Musculoskeletal: She exhibits no edema.  Neurological: She is alert and oriented to person, place, and time. Coordination normal.  Skin: Skin is warm and dry.  Psychiatric: She has a normal mood and affect.   Vitals:   10/15/15 0905  BP: 130/70  Pulse: (!) 57  Resp: 14  Temp: 98.3 F (36.8 C)  TempSrc: Oral  SpO2: 98%  Weight: 151 lb (68.5 kg)  Height: 5' 4.5" (1.638 m)      Assessment & Plan:  Tdap and flu given at visit.

## 2015-10-15 NOTE — Progress Notes (Signed)
Pre visit review using our clinic review tool, if applicable. No additional management support is needed unless otherwise documented below in the visit note. 

## 2015-10-15 NOTE — Patient Instructions (Addendum)
We have given you the tetanus and the flu shot today.   Come back anytime on 10/30/15 or after for the pneumonia shot.   We have sent in the anu-sol cream for the hemorrhoids. Consider seeing Dr. Carlean Purl back again to talk about hemorrhoid banding.   Health Maintenance, Female Adopting a healthy lifestyle and getting preventive care can go a long way to promote health and wellness. Talk with your health care provider about what schedule of regular examinations is right for you. This is a good chance for you to check in with your provider about disease prevention and staying healthy. In between checkups, there are plenty of things you can do on your own. Experts have done a lot of research about which lifestyle changes and preventive measures are most likely to keep you healthy. Ask your health care provider for more information. WEIGHT AND DIET  Eat a healthy diet  Be sure to include plenty of vegetables, fruits, low-fat dairy products, and lean protein.  Do not eat a lot of foods high in solid fats, added sugars, or salt.  Get regular exercise. This is one of the most important things you can do for your health.  Most adults should exercise for at least 150 minutes each week. The exercise should increase your heart rate and make you sweat (moderate-intensity exercise).  Most adults should also do strengthening exercises at least twice a week. This is in addition to the moderate-intensity exercise.  Maintain a healthy weight  Body mass index (BMI) is a measurement that can be used to identify possible weight problems. It estimates body fat based on height and weight. Your health care provider can help determine your BMI and help you achieve or maintain a healthy weight.  For females 93 years of age and older:   A BMI below 18.5 is considered underweight.  A BMI of 18.5 to 24.9 is normal.  A BMI of 25 to 29.9 is considered overweight.  A BMI of 30 and above is considered obese.   Watch levels of cholesterol and blood lipids  You should start having your blood tested for lipids and cholesterol at 66 years of age, then have this test every 5 years.  You may need to have your cholesterol levels checked more often if:  Your lipid or cholesterol levels are high.  You are older than 66 years of age.  You are at high risk for heart disease.  CANCER SCREENING   Lung Cancer  Lung cancer screening is recommended for adults 8-68 years old who are at high risk for lung cancer because of a history of smoking.  A yearly low-dose CT scan of the lungs is recommended for people who:  Currently smoke.  Have quit within the past 15 years.  Have at least a 30-pack-year history of smoking. A pack year is smoking an average of one pack of cigarettes a day for 1 year.  Yearly screening should continue until it has been 15 years since you quit.  Yearly screening should stop if you develop a health problem that would prevent you from having lung cancer treatment.  Breast Cancer  Practice breast self-awareness. This means understanding how your breasts normally appear and feel.  It also means doing regular breast self-exams. Let your health care provider know about any changes, no matter how small.  If you are in your 20s or 30s, you should have a clinical breast exam (CBE) by a health care provider every 1-3 years as  part of a regular health exam.  If you are 40 or older, have a CBE every year. Also consider having a breast X-ray (mammogram) every year.  If you have a family history of breast cancer, talk to your health care provider about genetic screening.  If you are at high risk for breast cancer, talk to your health care provider about having an MRI and a mammogram every year.  Breast cancer gene (BRCA) assessment is recommended for women who have family members with BRCA-related cancers. BRCA-related cancers  include:  Breast.  Ovarian.  Tubal.  Peritoneal cancers.  Results of the assessment will determine the need for genetic counseling and BRCA1 and BRCA2 testing. Cervical Cancer Your health care provider may recommend that you be screened regularly for cancer of the pelvic organs (ovaries, uterus, and vagina). This screening involves a pelvic examination, including checking for microscopic changes to the surface of your cervix (Pap test). You may be encouraged to have this screening done every 3 years, beginning at age 21.  For women ages 30-65, health care providers may recommend pelvic exams and Pap testing every 3 years, or they may recommend the Pap and pelvic exam, combined with testing for human papilloma virus (HPV), every 5 years. Some types of HPV increase your risk of cervical cancer. Testing for HPV may also be done on women of any age with unclear Pap test results.  Other health care providers may not recommend any screening for nonpregnant women who are considered low risk for pelvic cancer and who do not have symptoms. Ask your health care provider if a screening pelvic exam is right for you.  If you have had past treatment for cervical cancer or a condition that could lead to cancer, you need Pap tests and screening for cancer for at least 20 years after your treatment. If Pap tests have been discontinued, your risk factors (such as having a new sexual partner) need to be reassessed to determine if screening should resume. Some women have medical problems that increase the chance of getting cervical cancer. In these cases, your health care provider may recommend more frequent screening and Pap tests. Colorectal Cancer  This type of cancer can be detected and often prevented.  Routine colorectal cancer screening usually begins at 66 years of age and continues through 66 years of age.  Your health care provider may recommend screening at an earlier age if you have risk factors for  colon cancer.  Your health care provider may also recommend using home test kits to check for hidden blood in the stool.  A small camera at the end of a tube can be used to examine your colon directly (sigmoidoscopy or colonoscopy). This is done to check for the earliest forms of colorectal cancer.  Routine screening usually begins at age 50.  Direct examination of the colon should be repeated every 5-10 years through 66 years of age. However, you may need to be screened more often if early forms of precancerous polyps or small growths are found. Skin Cancer  Check your skin from head to toe regularly.  Tell your health care provider about any new moles or changes in moles, especially if there is a change in a mole's shape or color.  Also tell your health care provider if you have a mole that is larger than the size of a pencil eraser.  Always use sunscreen. Apply sunscreen liberally and repeatedly throughout the day.  Protect yourself by wearing long sleeves, pants,   a wide-brimmed hat, and sunglasses whenever you are outside. HEART DISEASE, DIABETES, AND HIGH BLOOD PRESSURE   High blood pressure causes heart disease and increases the risk of stroke. High blood pressure is more likely to develop in:  People who have blood pressure in the high end of the normal range (130-139/85-89 mm Hg).  People who are overweight or obese.  People who are African American.  If you are 18-39 years of age, have your blood pressure checked every 3-5 years. If you are 40 years of age or older, have your blood pressure checked every year. You should have your blood pressure measured twice--once when you are at a hospital or clinic, and once when you are not at a hospital or clinic. Record the average of the two measurements. To check your blood pressure when you are not at a hospital or clinic, you can use:  An automated blood pressure machine at a pharmacy.  A home blood pressure monitor.  If you  are between 55 years and 79 years old, ask your health care provider if you should take aspirin to prevent strokes.  Have regular diabetes screenings. This involves taking a blood sample to check your fasting blood sugar level.  If you are at a normal weight and have a low risk for diabetes, have this test once every three years after 66 years of age.  If you are overweight and have a high risk for diabetes, consider being tested at a younger age or more often. PREVENTING INFECTION  Hepatitis B  If you have a higher risk for hepatitis B, you should be screened for this virus. You are considered at high risk for hepatitis B if:  You were born in a country where hepatitis B is common. Ask your health care provider which countries are considered high risk.  Your parents were born in a high-risk country, and you have not been immunized against hepatitis B (hepatitis B vaccine).  You have HIV or AIDS.  You use needles to inject street drugs.  You live with someone who has hepatitis B.  You have had sex with someone who has hepatitis B.  You get hemodialysis treatment.  You take certain medicines for conditions, including cancer, organ transplantation, and autoimmune conditions. Hepatitis C  Blood testing is recommended for:  Everyone born from 1945 through 1965.  Anyone with known risk factors for hepatitis C. Sexually transmitted infections (STIs)  You should be screened for sexually transmitted infections (STIs) including gonorrhea and chlamydia if:  You are sexually active and are younger than 66 years of age.  You are older than 66 years of age and your health care provider tells you that you are at risk for this type of infection.  Your sexual activity has changed since you were last screened and you are at an increased risk for chlamydia or gonorrhea. Ask your health care provider if you are at risk.  If you do not have HIV, but are at risk, it may be recommended that you  take a prescription medicine daily to prevent HIV infection. This is called pre-exposure prophylaxis (PrEP). You are considered at risk if:  You are sexually active and do not regularly use condoms or know the HIV status of your partner(s).  You take drugs by injection.  You are sexually active with a partner who has HIV. Talk with your health care provider about whether you are at high risk of being infected with HIV. If you choose to begin   PrEP, you should first be tested for HIV. You should then be tested every 3 months for as long as you are taking PrEP.  PREGNANCY   If you are premenopausal and you may become pregnant, ask your health care provider about preconception counseling.  If you may become pregnant, take 400 to 800 micrograms (mcg) of folic acid every day.  If you want to prevent pregnancy, talk to your health care provider about birth control (contraception). OSTEOPOROSIS AND MENOPAUSE   Osteoporosis is a disease in which the bones lose minerals and strength with aging. This can result in serious bone fractures. Your risk for osteoporosis can be identified using a bone density scan.  If you are 41 years of age or older, or if you are at risk for osteoporosis and fractures, ask your health care provider if you should be screened.  Ask your health care provider whether you should take a calcium or vitamin D supplement to lower your risk for osteoporosis.  Menopause may have certain physical symptoms and risks.  Hormone replacement therapy may reduce some of these symptoms and risks. Talk to your health care provider about whether hormone replacement therapy is right for you.  HOME CARE INSTRUCTIONS   Schedule regular health, dental, and eye exams.  Stay current with your immunizations.   Do not use any tobacco products including cigarettes, chewing tobacco, or electronic cigarettes.  If you are pregnant, do not drink alcohol.  If you are breastfeeding, limit how  much and how often you drink alcohol.  Limit alcohol intake to no more than 1 drink per day for nonpregnant women. One drink equals 12 ounces of beer, 5 ounces of wine, or 1 ounces of hard liquor.  Do not use street drugs.  Do not share needles.  Ask your health care provider for help if you need support or information about quitting drugs.  Tell your health care provider if you often feel depressed.  Tell your health care provider if you have ever been abused or do not feel safe at home.   This information is not intended to replace advice given to you by your health care provider. Make sure you discuss any questions you have with your health care provider.   Document Released: 07/21/2010 Document Revised: 01/26/2014 Document Reviewed: 12/07/2012 Elsevier Interactive Patient Education [employer].

## 2015-10-16 LAB — HEPATITIS C ANTIBODY: HCV Ab: NEGATIVE

## 2015-10-29 ENCOUNTER — Ambulatory Visit: Payer: Medicare Other

## 2015-10-30 ENCOUNTER — Ambulatory Visit (INDEPENDENT_AMBULATORY_CARE_PROVIDER_SITE_OTHER): Payer: Medicare Other

## 2015-10-30 ENCOUNTER — Ambulatory Visit (INDEPENDENT_AMBULATORY_CARE_PROVIDER_SITE_OTHER)
Admission: RE | Admit: 2015-10-30 | Discharge: 2015-10-30 | Disposition: A | Discharge status: Home / Self Care | Payer: Medicare Other | Source: Ambulatory Visit | Attending: Internal Medicine | Admitting: Internal Medicine

## 2015-10-30 ENCOUNTER — Encounter: Payer: 59 | Admitting: Internal Medicine

## 2015-10-30 DIAGNOSIS — Z23 Encounter for immunization: Secondary | ICD-10-CM | POA: Diagnosis not present

## 2015-10-30 DIAGNOSIS — M8580 Other specified disorders of bone density and structure, unspecified site: Secondary | ICD-10-CM

## 2015-10-30 DIAGNOSIS — Z13820 Encounter for screening for osteoporosis: Secondary | ICD-10-CM | POA: Diagnosis not present

## 2015-10-30 DIAGNOSIS — Z0000 Encounter for general adult medical examination without abnormal findings: Secondary | ICD-10-CM

## 2015-12-24 ENCOUNTER — Ambulatory Visit: Payer: Medicare Other | Admitting: Sports Medicine

## 2015-12-31 ENCOUNTER — Ambulatory Visit (INDEPENDENT_AMBULATORY_CARE_PROVIDER_SITE_OTHER): Payer: Medicare Other | Admitting: Sports Medicine

## 2015-12-31 ENCOUNTER — Encounter: Payer: Self-pay | Admitting: Sports Medicine

## 2015-12-31 VITALS — BP 134/86 | HR 55 | Resp 18

## 2015-12-31 DIAGNOSIS — L84 Corns and callosities: Secondary | ICD-10-CM

## 2015-12-31 DIAGNOSIS — M79672 Pain in left foot: Secondary | ICD-10-CM | POA: Diagnosis not present

## 2015-12-31 DIAGNOSIS — M79671 Pain in right foot: Secondary | ICD-10-CM

## 2015-12-31 DIAGNOSIS — M21619 Bunion of unspecified foot: Secondary | ICD-10-CM | POA: Diagnosis not present

## 2015-12-31 DIAGNOSIS — M2040 Other hammer toe(s) (acquired), unspecified foot: Secondary | ICD-10-CM | POA: Diagnosis not present

## 2015-12-31 NOTE — Progress Notes (Signed)
   Subjective:    Patient ID: Angela Robbins, female    DOB: Mar 11, 1950, 65 y.o.   MRN: [id number]  HPI  I have some calluses and corns that need to be looked at and the corns on in between my 4th and 5th toes on both and the calluses are on the ball and have a build up and I have to trim them   Review of Systems  HENT: Positive for sinus pressure and sneezing.   Eyes: Positive for itching.  Gastrointestinal: Positive for blood in stool and constipation.  Neurological: Positive for headaches.  All other systems reviewed and are negative.      Objective:   Physical Exam        Assessment & Plan:

## 2015-12-31 NOTE — Progress Notes (Signed)
Subjective: Angela Robbins is a 65 y.o. female patient who presents to office for evaluation of Right> Left foot pain secondary to callus skin. Patient complains of pain at the lesions present Right>Left foot in between toes and to ball of right foot. Patient has tried trimming with a little relief in symptoms. Patient denies any other pedal complaints.   See additional HPI from nurse note  Patient Active Problem List   Diagnosis Date Noted  . Routine general medical examination at a health care facility 12/08/2013  . 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 IDA (iron deficiency anemia)? - hx of 09/27/2012  . IBS (irritable bowel syndrome) 09/27/2012  . Borderline hyperlipidemia 12/09/2007    Current Outpatient Prescriptions on File Prior to Visit  Medication Sig Dispense Refill  . Acacia POWD by Does not apply route.    . Astaxanthin 4 MG CAPS Take by mouth. Take one by mouth daily.    Marland Kitchen BIOTIN 5000 PO Take 1 tablet by mouth daily.    . Cholecalciferol (VITAMIN D3) 1000 UNITS CAPS Take 1 capsule by mouth daily.    . hydrocortisone (ANUSOL-HC) 2.5 % rectal cream Place 1 application rectally 2 (two) times daily. 30 g 11  . magnesium 30 MG tablet Take 30 mg by mouth 2 (two) times daily.      . NON FORMULARY 1 tablet. kyolic acid and garlic    . Omega-3 Fatty Acids (FISH OIL) 300 MG CAPS Take 1 capsule by mouth. Take 1 at ti  mes    . polycarbophil (FIBERCON) 625 MG tablet Take by mouth daily. 3 caps per day     . Probiotic Product (ALIGN) 4 MG CAPS Take 1 capsule by mouth daily.    . vitamin C (ASCORBIC ACID) 500 MG tablet Take 500 mg by mouth daily.    . [DISCONTINUED] Azelastine HCl (ASTEPRO) 0.15 % SOLN Place 1 spray into the nose 2 (two) times daily. 30 mL 3   No current facility-administered medications on file prior to visit.     Allergies  Allergen Reactions  . Prednisone Other (See Comments)    Nervous system reactions, anxious    Objective:  General: Alert and oriented x3 in no acute  distress  Dermatology: Keratotic lesion present 1st interspace bilateral, medial 1st toe bilateral, and ball of right foot with skin lines transversing the lesion, pain is present with direct pressure to the lesion with a central nucleated core noted R>L, no webspace macerations, no ecchymosis bilateral, all nails x 10 are well manicured.  Vascular: Dorsalis Pedis and Posterior Tibial pedal pulses 1/4, Capillary Fill Time 3 seconds, scant pedal hair growth bilateral, no edema bilateral lower extremities, Temperature gradient within normal limits.  Neurology: Johney Maine sensation intact via light touch bilateral.  Musculoskeletal: Mild tenderness with palpation at the keratotic lesion site on Right>Left, Muscular strength 5/5 in all groups without pain or limitation on range of motion. + bunion and hammertoe deformity noted bilateral.  Assessment and Plan: Problem List Items Addressed This Visit    None    Visit Diagnoses    Callus of foot    -  Primary   Bunion       Hammer toe, unspecified laterality       Foot pain, bilateral         -Complete examination performed -Discussed treatment options -Patient doesn't want any foot surgery -Parred keratoic lesions using a 15 blade -Dispensed toe spacers -Recommend steroid injection in R interspace continue to be painful;  patient declined at this visit -Encouraged daily skin emollients -Encouraged use of pumice stone -Advised good supportive shoes and inserts -Patient to return to office as needed or sooner if condition worsens.  Landis Martins, DPM

## 2016-01-17 ENCOUNTER — Encounter: Payer: Self-pay | Admitting: Internal Medicine

## 2016-01-17 ENCOUNTER — Telehealth: Payer: Self-pay | Admitting: Internal Medicine

## 2016-01-17 ENCOUNTER — Ambulatory Visit (INDEPENDENT_AMBULATORY_CARE_PROVIDER_SITE_OTHER): Payer: Medicare Other | Admitting: Internal Medicine

## 2016-01-17 DIAGNOSIS — K581 Irritable bowel syndrome with constipation: Secondary | ICD-10-CM | POA: Diagnosis not present

## 2016-01-17 DIAGNOSIS — K602 Anal fissure, unspecified: Secondary | ICD-10-CM

## 2016-01-17 MED ORDER — DILTIAZEM GEL 2 %: 1.0000 "application " | Freq: Three times a day (TID) | CUTANEOUS | 3 refills | Status: DC

## 2016-01-17 NOTE — Progress Notes (Signed)
   Callaway Duchon Ganci 65 y.o. [birth year] [id number]  Assessment & Plan:   Anal fissure Recurrent post fissure Continue Benefiber, add stool softener/Mg if needed Diltiazem 2% topical bid-tid and continue for 1 month after well See me prn  IBS (irritable bowel syndrome) Continue fiber and add stool softener/Mg++   Note that she also has some anal tags but I did not see sig internal hemorrhoids today. She may return prn for reassessment re: ? Hemorrhoids once fissure feels better.  I appreciate the opportunity to care for this patient. [id number] A Crawford, MD   Subjective:   Chief Complaint: rectal pain  HPI  Very nice woman w/ IBS and hx anal fissure 2014 - also had o/w negative colonoscopy 2014. Used rectal diltiazem gel with relief thenShe is having intermittent posterior rectal pain with and after defecation and slight bright red rectal bleeding. Says stools are sometimes hard - this is unpredictable. She uses Benefiber 2 tbsp/day. Sometimes takes a stool softener and/or Mg++ which helps soften stools.  Medications, allergies, past medical history, past surgical history, family history and social history are reviewed and updated in the EMR.   Review of Systems As above  Objective:   Physical Exam BP 140/80   Pulse 76  NAD Patti [place], [place] present.  Rectal - anoderm with small r/L tags and a small posterior sentinel pile suspected - mild stenosis and posterior pain  Anoscopy: posterior anal fissure seen Gr 1 int hemorrhoids not inflamed

## 2016-01-17 NOTE — Patient Instructions (Addendum)
   Today you have been given a handout to read and follow on anal fissures.    We have faxed your Diltiazem gel rx to [facility].    Add your magnesium or stool softner to help make bowel movements easier.     I appreciate the opportunity to care for you. Silvano Rusk, MD, [facility]

## 2016-01-17 NOTE — Assessment & Plan Note (Signed)
Recurrent post fissure Continue Benefiber, add stool softener/Mg if needed Diltiazem 2% topical bid-tid and continue for 1 month after well See me prn

## 2016-01-17 NOTE — Assessment & Plan Note (Signed)
Continue fiber and add stool softener/Mg++

## 2016-01-17 NOTE — Telephone Encounter (Signed)
Patient will come in and see Dr. Carlean Purl today at 2:00

## 2016-02-03 ENCOUNTER — Telehealth: Payer: Self-pay | Admitting: Internal Medicine

## 2016-02-03 NOTE — Telephone Encounter (Signed)
Patient walked in asking for a BP check. Advised would need MD ok. No hx of bp issues. She refused to schedule an appt. Advised to let us know if she feels her bp is still elevated. Patient walked out.

## 2016-02-04 NOTE — Telephone Encounter (Signed)
Noted  

## 2016-02-12 ENCOUNTER — Other Ambulatory Visit: Payer: Self-pay | Admitting: Internal Medicine

## 2016-02-12 DIAGNOSIS — Z1231 Encounter for screening mammogram for malignant neoplasm of breast: Secondary | ICD-10-CM

## 2016-03-09 ENCOUNTER — Ambulatory Visit
Admission: RE | Admit: 2016-03-09 | Discharge: 2016-03-09 | Disposition: A | Discharge status: Home / Self Care | Payer: Medicare Other | Source: Ambulatory Visit | Attending: Internal Medicine | Admitting: Internal Medicine

## 2016-03-09 DIAGNOSIS — Z1231 Encounter for screening mammogram for malignant neoplasm of breast: Secondary | ICD-10-CM

## 2016-06-29 ENCOUNTER — Encounter: Payer: Medicare Other | Admitting: Internal Medicine

## 2016-07-08 ENCOUNTER — Telehealth: Payer: Self-pay | Admitting: *Deleted

## 2016-07-08 NOTE — Telephone Encounter (Signed)
Called and left VM to inform the patient that [facility] will not be able to do AWV at 0800 07/10/16 because it has not been a year pass her previous wellness visit. The VM did explain that she will have her appointment at [facility] with Wilfred Lacy NP. The nurse requested that the patient call her back for further questions or concerns and contact number was given.

## 2016-07-09 ENCOUNTER — Telehealth: Payer: Self-pay | Admitting: *Deleted

## 2016-07-09 NOTE — Progress Notes (Signed)
Subjective:   Angela Robbins is a 66 y.o. female who presents for Medicare Annual (Subsequent) preventive examination.  Review of Systems:  No ROS.  Medicare Wellness Visit. Additional risk factors are reflected in the social history.  Cardiac Risk Factors include: advanced age (>47men, >63 women);dyslipidemia Sleep patterns: feels rested on waking, gets up 1 times nightly to void and sleeps 6-7 hours nightly.  Periodically has insomnia, education attached to AVS  Home Safety/Smoke Alarms: Feels safe in home. Smoke alarms in place.  Living environment; residence and Firearm Safety: 2-story house, no firearms. Seat Belt Safety/Bike Helmet: Wears seat belt.   Counseling:   Eye Exam-appointment yearly  Dental- appointment every 6 months   Female:   Pap- N/A      Mammo- Last 03/09/16,BI-RADS CATEGORY  1: Negative     Dexa scan- Last 10/30/15, ostoepenia     CCS- Last 12/07/12, benign polyp, recall 10 years     Objective:     Vitals: BP 128/82   Pulse (!) 56   Temp 98.2 F (36.8 C)   Resp 20   Ht 5\' 5"  (1.651 m)   Wt 154 lb (69.9 kg)   SpO2 98%   BMI 25.63 kg/m   Body mass index is 25.63 kg/m.   Tobacco History  Smoking Status  . Never Smoker  Smokeless Tobacco  . Never Used     Counseling given: Not Answered   Past Medical History:  Diagnosis Date  . Allergy   . 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 Hyperlipidemia   . IBS (irritable bowel syndrome)   . Tubular adenoma 08/26/2007   Past Surgical History:  Procedure Laterality Date  . COLONOSCOPY  08/26/2007; 2014   Family History  Problem Relation Age of Onset  . Hypertension Mother   . Cancer Mother   . Heart disease Father   . Diabetes Father   . Colon cancer Neg Hx    History  Sexual Activity  . Sexual activity: Yes    Outpatient Encounter Prescriptions as of 07/10/2016  Medication Sig  . Acacia POWD by Does not apply route.  . Astaxanthin 4 MG CAPS Take by mouth. Take one by mouth daily.  Marland Kitchen BIOTIN  5000 PO Take 1 tablet by mouth daily.  . Cholecalciferol (VITAMIN D3) 1000 UNITS CAPS Take 1 capsule by mouth daily.  Marland Kitchen diltiazem 2 % GEL Apply 1 application topically 3 (three) times daily.  . Garlic (GARLIQUE) 967 MG TBEC Take 1 tablet by mouth daily.  . magnesium 30 MG tablet Take 30 mg by mouth 2 (two) times daily.    . NON FORMULARY 1 tablet. kyolic acid and garlic  . polycarbophil (FIBERCON) 625 MG tablet Take by mouth daily. 3 caps per day   . Probiotic Product (ALIGN) 4 MG CAPS Take 1 capsule by mouth daily.  . vitamin C (ASCORBIC ACID) 500 MG tablet Take 500 mg by mouth daily.  Marland Kitchen Zoster Vac Recomb Adjuvanted [facility]) injection Inject 0.5 mLs into the muscle once.   No facility-administered encounter medications on file as of 07/10/2016.     Activities of Daily Living In your present state of health, do you have any difficulty performing the following activities: 07/10/2016  Hearing? N  Vision? N  Difficulty concentrating or making decisions? N  Walking or climbing stairs? N  Dressing or bathing? N  Doing errands, shopping? N  Preparing Food and eating ? N  Using the Toilet? N  In the past six months, have  you accidently leaked urine? N  Do you have problems with loss of bowel control? N  Managing your Medications? N  Managing your Finances? N  Housekeeping or managing your Housekeeping? N  Some recent data might be hidden    Patient Care Team: Hoyt Koch, MD as PCP - General (Internal Medicine) Gatha Mayer, MD as Consulting Physician (Gastroenterology)    Assessment:    Physical assessment deferred to PCP.  Exercise Activities and Dietary recommendations Current Exercise Habits: Home exercise routine, Type of exercise: stretching;Other - see comments (Stationary bike,  gardening), Time (Minutes): 30, Frequency (Times/Week): 6, Weekly Exercise (Minutes/Week): 180, Intensity: Moderate, Exercise limited by: None identified  Diet (meal preparation, eat  out, water intake, caffeinated beverages, dairy products, fruits and vegetables): in general, a "healthy" diet  , well balanced, low fat/ cholesterol, low salt Diet education was attached to patient's AVS.   Goals    . lose weight          Be proactive to keep strong and independent, continue to eat healthy, exercise, and do core strengthening exercises.      Fall Risk Fall Risk  07/10/2016 10/15/2015  Falls in the past year? No No   Depression Screen PHQ 2/9 Scores 07/10/2016 10/15/2015  PHQ - 2 Score 0 0     Cognitive Function       Ad8 score reviewed for issues:  Issues making decisions: no  Less interest in hobbies / activities: no  Repeats questions, stories (family complaining): no  Trouble using ordinary gadgets (microwave, computer, phone):no  Forgets the month or year: no  Mismanaging finances: no  Remembering appts: no  Daily problems with thinking and/or memory: no Ad8 score is= 0  Immunization History  Administered Date(s) Administered  . Influenza Split 11/10/2011  . Influenza Whole 10/19/2008, 10/25/2009  . Influenza,inj,Quad PF,36+ Mos 10/21/2012, 10/26/2013, 10/29/2014, 10/15/2015  . Pneumococcal Conjugate-13 10/30/2015  . Pneumococcal Polysaccharide-23 10/29/2014  . Td 01/19/2005  . Tdap 10/15/2015  . Zoster 12/25/2009   Screening Tests Health Maintenance  Topic Date Due  . INFLUENZA VACCINE  08/19/2016  . MAMMOGRAM  03/09/2018  . PNA vac Low Risk Adult (2 of 2 - PPSV23) 10/29/2019  . COLONOSCOPY  12/08/2022  . TETANUS/TDAP  10/14/2025  . DEXA SCAN  Completed  . Hepatitis C Screening  Completed      Plan:    Continue to eat heart healthy diet (full of fruits, vegetables, whole grains, lean protein, water--limit salt, fat, and sugar intake) and increase physical activity as tolerated.  Continue doing brain stimulating activities (puzzles, reading, adult coloring books, staying active) to keep memory sharp.   I have personally reviewed  and noted the following in the patient's chart:   . Medical and social history . Use of alcohol, tobacco or illicit drugs  . Current medications and supplements . Functional ability and status . Nutritional status . Physical activity . Advanced directives . List of other physicians . Vitals . Screenings to include cognitive, depression, and falls . Referrals and appointments  In addition, I have reviewed and discussed with patient certain preventive protocols, quality metrics, and best practice recommendations. A written personalized care plan for preventive services as well as general preventive health recommendations were provided to patient.     Michiel Cowboy, RN  07/10/2016

## 2016-07-09 NOTE — Telephone Encounter (Signed)
Called to inform patient that health coach nurse made a mistake and that she can have her AWV. Patient understood and asked if she could come in at 0830 instead of 0800 to decrease the amount of time spent at the doctor's office.  Nurse stated that she could quickly run through the visit and that she looked forward to seeing Angela Robbins on 6/22 at 0830.

## 2016-07-09 NOTE — Progress Notes (Signed)
Pre visit review using our clinic review tool, if applicable. No additional management support is needed unless otherwise documented below in the visit note. 

## 2016-07-10 ENCOUNTER — Encounter: Payer: Self-pay | Admitting: Nurse Practitioner

## 2016-07-10 ENCOUNTER — Other Ambulatory Visit (INDEPENDENT_AMBULATORY_CARE_PROVIDER_SITE_OTHER): Payer: Medicare Other

## 2016-07-10 ENCOUNTER — Ambulatory Visit (INDEPENDENT_AMBULATORY_CARE_PROVIDER_SITE_OTHER): Payer: Medicare Other | Admitting: Nurse Practitioner

## 2016-07-10 VITALS — BP 128/82 | HR 56 | Temp 98.2°F | Resp 20 | Ht 65.0 in | Wt 154.0 lb

## 2016-07-10 DIAGNOSIS — H00012 Hordeolum externum right lower eyelid: Secondary | ICD-10-CM

## 2016-07-10 DIAGNOSIS — Z0001 Encounter for general adult medical examination with abnormal findings: Secondary | ICD-10-CM | POA: Diagnosis not present

## 2016-07-10 DIAGNOSIS — Z299 Encounter for prophylactic measures, unspecified: Secondary | ICD-10-CM | POA: Diagnosis not present

## 2016-07-10 DIAGNOSIS — E785 Hyperlipidemia, unspecified: Secondary | ICD-10-CM | POA: Diagnosis not present

## 2016-07-10 DIAGNOSIS — Z0000 Encounter for general adult medical examination without abnormal findings: Secondary | ICD-10-CM

## 2016-07-10 LAB — CBC
HCT: 39.2 % (ref 36.0–46.0)
Hemoglobin: 13.5 g/dL (ref 12.0–15.0)
MCHC: 34.5 g/dL (ref 30.0–36.0)
MCV: 88.5 fl (ref 78.0–100.0)
Platelets: 231 10*3/uL (ref 150.0–400.0)
RBC: 4.43 Mil/uL (ref 3.87–5.11)
RDW: 13.5 % (ref 11.5–15.5)
WBC: 3.7 10*3/uL — ABNORMAL LOW (ref 4.0–10.5)

## 2016-07-10 LAB — LIPID PANEL
CHOLESTEROL: 261 mg/dL — AB (ref 0–200)
HDL: 69.4 mg/dL (ref >39.00)
LDL Cholesterol: 170 mg/dL — ABNORMAL HIGH (ref 0–99)
NonHDL: 191.21
Total CHOL/HDL Ratio: 4
Triglycerides: 104 mg/dL (ref 0.0–149.0)
VLDL: 20.8 mg/dL (ref 0.0–40.0)

## 2016-07-10 LAB — COMPREHENSIVE METABOLIC PANEL
ALT: 15 U/L (ref 0–35)
AST: 24 U/L (ref 0–37)
Albumin: 4.4 g/dL (ref 3.5–5.2)
Alkaline Phosphatase: 48 U/L (ref 39–117)
BILIRUBIN TOTAL: 0.7 mg/dL (ref 0.2–1.2)
BUN: 14 mg/dL (ref 6–23)
CALCIUM: 9.8 mg/dL (ref 8.4–10.5)
CO2: 30 mEq/L (ref 19–32)
CREATININE: 0.85 mg/dL (ref 0.40–1.20)
Chloride: 104 mEq/L (ref 96–112)
GFR: 71 mL/min (ref >60.00)
GLUCOSE: 92 mg/dL (ref 70–99)
Potassium: 4.9 mEq/L (ref 3.5–5.1)
Sodium: 141 mEq/L (ref 135–145)
TOTAL PROTEIN: 7 g/dL (ref 6.0–8.3)

## 2016-07-10 LAB — TSH: TSH: 3.04 u[IU]/mL (ref 0.35–4.50)

## 2016-07-10 MED ORDER — ZOSTER VAC RECOMB ADJUVANTED 50 MCG/0.5ML IM SUSR: 0.5000 mL | Freq: Once | INTRAMUSCULAR | 0 refills | Status: AC

## 2016-07-10 NOTE — Patient Instructions (Addendum)
Stye A stye is a bump on your eyelid caused by a bacterial infection. A stye can form inside the eyelid (internal stye) or outside the eyelid (external stye). An internal stye may be caused by an infected oil-producing gland inside your eyelid. An external stye may be caused by an infection at the base of your eyelash (hair follicle). Styes are very common. Anyone can get them at any age. They usually occur in just one eye, but you may have more than one in either eye. What are the causes? The infection is almost always caused by bacteria called Staphylococcus aureus. This is a common type of bacteria that lives on your skin. What increases the risk? You may be at higher risk for a stye if you have had one before. You may also be at higher risk if you have:  Diabetes.  Long-term illness.  Long-term eye redness.  A skin condition called seborrhea.  High fat levels in your blood (lipids).  What are the signs or symptoms? Eyelid pain is the most common symptom of a stye. Internal styes are more painful than external styes. Other signs and symptoms may include:  Painful swelling of your eyelid.  A scratchy feeling in your eye.  Tearing and redness of your eye.  Pus draining from the stye.  How is this diagnosed? Your health care provider may be able to diagnose a stye just by examining your eye. The health care provider may also check to make sure:  You do not have a fever or other signs of a more serious infection.  The infection has not spread to other parts of your eye or areas around your eye.  How is this treated? Most styes will clear up in a few days without treatment. In some cases, you may need to use antibiotic drops or ointment to prevent infection. Your health care provider may have to drain the stye surgically if your stye is:  Large.  Causing a lot of pain.  Interfering with your vision.  This can be done using a thin blade or a needle. Follow these  instructions at home:  Take medicines only as directed by your health care provider.  Apply a clean, warm compress to your eye for 10 minutes, 4 times a day.  Do not wear contact lenses or eye makeup until your stye has healed.  Do not try to pop or drain the stye. Contact a health care provider if:  You have chills or a fever.  Your stye does not go away after several days.  Your stye affects your vision.  Your eyeball becomes swollen, red, or painful. This information is not intended to replace advice given to you by your health care provider. Make sure you discuss any questions you have with your health care provider. Document Released: 10/15/2004 Document Revised: 09/01/2015 Document Reviewed: 04/21/2013 Elsevier Interactive Patient Education  2018 [place] to eat heart healthy diet (full of fruits, vegetables, whole grains, lean protein, water--limit salt, fat, and sugar intake) and increase physical activity as tolerated.  Continue doing brain stimulating activities (puzzles, reading, adult coloring books, staying active) to keep memory sharp.    Ms. Rincon , Thank you for taking time to come for your Medicare Wellness Visit. I appreciate your ongoing commitment to your health goals. Please review the following plan we discussed and let me know if I can assist you in the future.   These are the goals we discussed: Goals    .  lose weight          Be proactive to keep strong and independent, continue to eat healthy, exercise, and do core strengthening exercises.       This is a list of the screening recommended for you and due dates:  Health Maintenance  Topic Date Due  . Flu Shot  08/19/2016  . Mammogram  03/09/2018  . Pneumonia vaccines (2 of 2 - PPSV23) 10/29/2019  . Colon Cancer Screening  12/08/2022  . Tetanus Vaccine  10/14/2025  . DEXA scan (bone density measurement)  Completed  .  Hepatitis C: One time screening is recommended by Center  for Disease Control  (CDC) for  adults born from [birth year] through 1966.   Completed   Insomnia Insomnia is a sleep disorder that makes it difficult to fall asleep or to stay asleep. Insomnia can cause tiredness (fatigue), low energy, difficulty concentrating, mood swings, and poor performance at work or school. There are three different ways to classify insomnia:  Difficulty falling asleep.  Difficulty staying asleep.  Waking up too early in the morning.  Any type of insomnia can be long-term (chronic) or short-term (acute). Both are common. Short-term insomnia usually lasts for three months or less. Chronic insomnia occurs at least three times a week for longer than three months. What are the causes? Insomnia may be caused by another condition, situation, or substance, such as:  Anxiety.  Certain medicines.  Gastroesophageal reflux disease (GERD) or other gastrointestinal conditions.  Asthma or other breathing conditions.  Restless legs syndrome, sleep apnea, or other sleep disorders.  Chronic pain.  Menopause. This may include hot flashes.  Stroke.  Abuse of alcohol, tobacco, or illegal drugs.  Depression.  Caffeine.  Neurological disorders, such as Alzheimer disease.  An overactive thyroid (hyperthyroidism).  The cause of insomnia may not be known. What increases the risk? Risk factors for insomnia include:  Gender. Women are more commonly affected than men.  Age. Insomnia is more common as you get older.  Stress. This may involve your professional or personal life.  Income. Insomnia is more common in people with lower income.  Lack of exercise.  Irregular work schedule or night shifts.  Traveling between different time zones.  What are the signs or symptoms? If you have insomnia, trouble falling asleep or trouble staying asleep is the main symptom. This may lead to other symptoms, such as:  Feeling fatigued.  Feeling nervous about going to  sleep.  Not feeling rested in the morning.  Having trouble concentrating.  Feeling irritable, anxious, or depressed.  How is this treated? Treatment for insomnia depends on the cause. If your insomnia is caused by an underlying condition, treatment will focus on addressing the condition. Treatment may also include:  Medicines to help you sleep.  Counseling or therapy.  Lifestyle adjustments.  Follow these instructions at home:  Take medicines only as directed by your health care provider.  Keep regular sleeping and waking hours. Avoid naps.  Keep a sleep diary to help you and your health care provider figure out what could be causing your insomnia. Include: ? When you sleep. ? When you wake up during the night. ? How well you sleep. ? How rested you feel the next day. ? Any side effects of medicines you are taking. ? What you eat and drink.  Make your bedroom a comfortable place where it is easy to fall asleep: ? Put up shades or special blackout curtains to block light from outside. ?  Use a white noise machine to block noise. ? Keep the temperature cool.  Exercise regularly as directed by your health care provider. Avoid exercising right before bedtime.  Use relaxation techniques to manage stress. Ask your health care provider to suggest some techniques that may work well for you. These may include: ? Breathing exercises. ? Routines to release muscle tension. ? Visualizing peaceful scenes.  Cut back on alcohol, caffeinated beverages, and cigarettes, especially close to bedtime. These can disrupt your sleep.  Do not overeat or eat spicy foods right before bedtime. This can lead to digestive discomfort that can make it hard for you to sleep.  Limit screen use before bedtime. This includes: ? Watching TV. ? Using your smartphone, tablet, and computer.  Stick to a routine. This can help you fall asleep faster. Try to do a quiet activity, brush your teeth, and go to bed  at the same time each night.  Get out of bed if you are still awake after 15 minutes of trying to sleep. Keep the lights down, but try reading or doing a quiet activity. When you feel sleepy, go back to bed.  Make sure that you drive carefully. Avoid driving if you feel very sleepy.  Keep all follow-up appointments as directed by your health care provider. This is important. Contact a health care provider if:  You are tired throughout the day or have trouble in your daily routine due to sleepiness.  You continue to have sleep problems or your sleep problems get worse. Get help right away if:  You have serious thoughts about hurting yourself or someone else. This information is not intended to replace advice given to you by your health care provider. Make sure you discuss any questions you have with your health care provider. Document Released: 01/03/2000 Document Revised: 06/07/2015 Document Reviewed: 10/06/2013 Elsevier Interactive Patient Education  [employer].

## 2016-07-10 NOTE — Progress Notes (Signed)
Subjective:  Patient ID: Angela Robbins, female    DOB: 08-May-1950  Age: 66 y.o. MRN: [id number]  CC: Medicare Wellness (red spot in right eye need to be look at/shindrix sent to [facility]/)   Eye Problem   The right eye is affected. This is a new problem. The current episode started yesterday. The problem occurs constantly. The problem has been unchanged. There was no injury mechanism. The patient is experiencing no pain. There is no known exposure to pink eye. She does not wear contacts. Associated symptoms include itching. Pertinent negatives include no blurred vision, eye discharge, double vision, eye redness, fever, foreign body sensation, nausea, photophobia, recent URI or vomiting. Associated symptoms comments: Nodule on lower eyelid. She has tried nothing for the symptoms.    Outpatient Medications Prior to Visit  Medication Sig Dispense Refill  . Acacia POWD by Does not apply route.    . Astaxanthin 4 MG CAPS Take by mouth. Take one by mouth daily.    Marland Kitchen BIOTIN 5000 PO Take 1 tablet by mouth daily.    . Cholecalciferol (VITAMIN D3) 1000 UNITS CAPS Take 1 capsule by mouth daily.    Marland Kitchen diltiazem 2 % GEL Apply 1 application topically 3 (three) times daily. 30 g 3  . Garlic (GARLIQUE) 253 MG TBEC Take 1 tablet by mouth daily.    . magnesium 30 MG tablet Take 30 mg by mouth 2 (two) times daily.      . NON FORMULARY 1 tablet. kyolic acid and garlic    . polycarbophil (FIBERCON) 625 MG tablet Take by mouth daily. 3 caps per day     . Probiotic Product (ALIGN) 4 MG CAPS Take 1 capsule by mouth daily.    . vitamin C (ASCORBIC ACID) 500 MG tablet Take 500 mg by mouth daily.     No facility-administered medications prior to visit.     ROS Review of Systems  Constitutional: Negative for fever, malaise/fatigue and weight loss.  HENT: Negative for congestion and sore throat.   Eyes: Positive for itching. Negative for blurred vision, double vision, photophobia, discharge and redness.   Negative for visual changes  Respiratory: Negative for cough and shortness of breath.   Cardiovascular: Negative for chest pain, palpitations and leg swelling.  Gastrointestinal: Positive for constipation. Negative for blood in stool, diarrhea, heartburn, nausea and vomiting.       Chronic  Genitourinary: Negative for dysuria, frequency and urgency.  Musculoskeletal: Negative for falls, joint pain and myalgias.  Skin: Negative for rash.  Neurological: Negative for dizziness, sensory change and headaches.  Endo/Heme/Allergies: Does not bruise/bleed easily.  Psychiatric/Behavioral: Negative for depression, substance abuse and suicidal ideas. The patient is not nervous/anxious.     Objective:  BP 128/82   Pulse (!) 56   Temp 98.2 F (36.8 C)   Resp 20   Ht 5\' 5"  (1.651 m)   Wt 154 lb (69.9 kg)   SpO2 98%   BMI 25.63 kg/m   BP Readings from Last 3 Encounters:  07/10/16 128/82  01/17/16 140/80  12/31/15 134/86    Wt Readings from Last 3 Encounters:  07/10/16 154 lb (69.9 kg)  01/17/16 153 lb (69.4 kg)  10/15/15 151 lb (68.5 kg)    Physical Exam  Constitutional: She is oriented to person, place, and time. No distress.  HENT:  Right Ear: External ear normal.  Left Ear: External ear normal.  Nose: Nose normal.  Mouth/Throat: No oropharyngeal exudate.  Eyes: Conjunctivae and EOM are  normal. Pupils are equal, round, and reactive to light. Right eye exhibits hordeolum. Right eye exhibits no chemosis and no discharge. No foreign body present in the right eye. Left eye exhibits no chemosis, no discharge and no hordeolum. No foreign body present in the left eye. No scleral icterus.  Neck: Normal range of motion. Neck supple.  Cardiovascular: Normal rate, regular rhythm and normal heart sounds.   Pulmonary/Chest: Effort normal and breath sounds normal. No respiratory distress.  Abdominal: Soft. She exhibits no distension.  Musculoskeletal: Normal range of motion. She exhibits no  edema.  Lymphadenopathy:    She has no cervical adenopathy.  Neurological: She is alert and oriented to person, place, and time.  Skin: Skin is warm and dry.  Psychiatric: She has a normal mood and affect. Her behavior is normal.    Lab Results  Component Value Date   WBC 4.5 10/15/2015   HGB 12.6 10/15/2015   HCT 36.4 10/15/2015   PLT 226.0 10/15/2015   GLUCOSE 90 10/15/2015   CHOL 244 (H) 10/15/2015   TRIG 92.0 10/15/2015   HDL 72.40 10/15/2015   LDLDIRECT 177.1 01/31/2013   LDLCALC 153 (H) 10/15/2015   ALT 13 10/15/2015   AST 23 10/15/2015   NA 142 10/15/2015   K 4.4 10/15/2015   CL 105 10/15/2015   CREATININE 0.81 10/15/2015   BUN 15 10/15/2015   CO2 32 10/15/2015   TSH 2.37 07/27/2012    Mm Screening Breast Tomo Bilateral  Result Date: 03/09/2016 CLINICAL DATA:  Screening. EXAM: 2D DIGITAL SCREENING BILATERAL MAMMOGRAM WITH CAD AND ADJUNCT TOMO COMPARISON:  Previous exam(s). ACR Breast Density Category c: The breast tissue is heterogeneously dense, which may obscure small masses. FINDINGS: There are no findings suspicious for malignancy. Images were processed with CAD. IMPRESSION: No mammographic evidence of malignancy. A result letter of this screening mammogram will be mailed directly to the patient. RECOMMENDATION: Screening mammogram in one year. (Code:SM-B-01Y) BI-RADS CATEGORY  1: Negative. Electronically Signed   By: Everlean Alstrom M.D.   On: 03/09/2016 13:12    Assessment & Plan:   Frida was seen today for medicare wellness.  Diagnoses and all orders for this visit:  Encounter for Medicare annual wellness exam  Encounter for preventative adult health care exam with abnormal findings -     Zoster Vac Recomb Adjuvanted [facility]) injection; Inject 0.5 mLs into the muscle once. -     Comprehensive metabolic panel; Future -     CBC; Future -     TSH; Future  Hordeolum externum of right lower eyelid  Borderline hyperlipidemia -     Lipid panel;  Future  Need for prophylactic measure -     Zoster Vac Recomb Adjuvanted [facility]) injection; Inject 0.5 mLs into the muscle once.   I am having Ms. George start on Zoster Vac Recomb Adjuvanted. I am also having her maintain her magnesium, polycarbophil, ALIGN, Vitamin D3, vitamin C, BIOTIN 5000 PO, NON FORMULARY, Astaxanthin, Acacia, Garlic, and diltiazem.  Meds ordered this encounter  Medications  . Zoster Vac Recomb Adjuvanted [facility]) injection    Sig: Inject 0.5 mLs into the muscle once.    Dispense:  0.5 mL    Refill:  0    Order Specific Question:   Supervising Provider    Answer:   Binnie Rail [[id number]]   Medical screening examination/treatment/procedure(s) were performed by he Nino Parsley, RN. As primary care provider I was immediately available for consulation/collaboration. I agree with above documentation. Baldo Ash  Nche, AGNP-C  Follow-up: Return in about 6 months (around 01/09/2017) for hyperlipidemia.  Wilfred Lacy, NP

## 2016-10-20 ENCOUNTER — Ambulatory Visit (INDEPENDENT_AMBULATORY_CARE_PROVIDER_SITE_OTHER): Payer: Medicare Other

## 2016-10-20 DIAGNOSIS — Z23 Encounter for immunization: Secondary | ICD-10-CM | POA: Diagnosis not present

## 2016-10-21 ENCOUNTER — Encounter: Payer: Medicare Other | Admitting: Internal Medicine

## 2017-03-18 ENCOUNTER — Other Ambulatory Visit: Payer: Self-pay | Admitting: Internal Medicine

## 2017-03-18 DIAGNOSIS — Z139 Encounter for screening, unspecified: Secondary | ICD-10-CM

## 2017-04-08 ENCOUNTER — Ambulatory Visit
Admission: RE | Admit: 2017-04-08 | Discharge: 2017-04-08 | Disposition: A | Discharge status: Home / Self Care | Payer: Medicare Other | Source: Ambulatory Visit | Attending: Internal Medicine | Admitting: Internal Medicine

## 2017-04-08 DIAGNOSIS — Z139 Encounter for screening, unspecified: Secondary | ICD-10-CM

## 2017-04-30 ENCOUNTER — Encounter (INDEPENDENT_AMBULATORY_CARE_PROVIDER_SITE_OTHER): Payer: Self-pay

## 2017-07-02 ENCOUNTER — Ambulatory Visit (INDEPENDENT_AMBULATORY_CARE_PROVIDER_SITE_OTHER): Payer: Medicare Other | Admitting: Internal Medicine

## 2017-07-02 ENCOUNTER — Other Ambulatory Visit (INDEPENDENT_AMBULATORY_CARE_PROVIDER_SITE_OTHER): Payer: Medicare Other

## 2017-07-02 ENCOUNTER — Encounter: Payer: Self-pay | Admitting: Internal Medicine

## 2017-07-02 VITALS — BP 110/80 | HR 52 | Temp 97.6°F | Ht 65.0 in | Wt 158.0 lb

## 2017-07-02 DIAGNOSIS — Z0000 Encounter for general adult medical examination without abnormal findings: Secondary | ICD-10-CM

## 2017-07-02 DIAGNOSIS — K581 Irritable bowel syndrome with constipation: Secondary | ICD-10-CM | POA: Diagnosis not present

## 2017-07-02 DIAGNOSIS — E785 Hyperlipidemia, unspecified: Secondary | ICD-10-CM | POA: Diagnosis not present

## 2017-07-02 LAB — LIPID PANEL
CHOLESTEROL: 276 mg/dL — AB (ref 0–200)
HDL: 78.3 mg/dL (ref >39.00)
LDL CALC: 176 mg/dL — AB (ref 0–99)
NonHDL: 197.2
Total CHOL/HDL Ratio: 4
Triglycerides: 108 mg/dL (ref 0.0–149.0)
VLDL: 21.6 mg/dL (ref 0.0–40.0)

## 2017-07-02 LAB — CBC
HEMATOCRIT: 38.5 % (ref 36.0–46.0)
Hemoglobin: 13.3 g/dL (ref 12.0–15.0)
MCHC: 34.7 g/dL (ref 30.0–36.0)
MCV: 89.8 fl (ref 78.0–100.0)
Platelets: 226 10*3/uL (ref 150.0–400.0)
RBC: 4.29 Mil/uL (ref 3.87–5.11)
RDW: 13.7 % (ref 11.5–15.5)
WBC: 4 10*3/uL (ref 4.0–10.5)

## 2017-07-02 LAB — COMPREHENSIVE METABOLIC PANEL
ALBUMIN: 4.4 g/dL (ref 3.5–5.2)
ALK PHOS: 54 U/L (ref 39–117)
ALT: 16 U/L (ref 0–35)
AST: 22 U/L (ref 0–37)
BUN: 12 mg/dL (ref 6–23)
CALCIUM: 9.8 mg/dL (ref 8.4–10.5)
CHLORIDE: 104 meq/L (ref 96–112)
CO2: 28 mEq/L (ref 19–32)
CREATININE: 0.88 mg/dL (ref 0.40–1.20)
GFR: 68.01 mL/min (ref >60.00)
Glucose, Bld: 92 mg/dL (ref 70–99)
Potassium: 4.5 mEq/L (ref 3.5–5.1)
SODIUM: 142 meq/L (ref 135–145)
TOTAL PROTEIN: 7.3 g/dL (ref 6.0–8.3)
Total Bilirubin: 0.6 mg/dL (ref 0.2–1.2)

## 2017-07-02 LAB — TSH: TSH: 5.05 u[IU]/mL — ABNORMAL HIGH (ref 0.35–4.50)

## 2017-07-02 NOTE — Assessment & Plan Note (Signed)
Stable at this time, still working with GI for strategies to help.

## 2017-07-02 NOTE — Progress Notes (Signed)
   Subjective:    Patient ID: Angela Robbins, female    DOB: 11/28/1950, 67 y.o.   MRN: [id number]  HPI The patient is a 67 YO female coming in for physical. Denies new concerns.   PMH, [facility], social history reviewed and updated.   Review of Systems  Constitutional: Negative.   HENT: Negative.   Eyes: Negative.   Respiratory: Negative for cough, chest tightness and shortness of breath.   Cardiovascular: Negative for chest pain, palpitations and leg swelling.  Gastrointestinal: Negative for abdominal distention, abdominal pain, constipation, diarrhea, nausea and vomiting.  Musculoskeletal: Negative.   Skin: Negative.   Neurological: Negative.   Psychiatric/Behavioral: Negative.       Objective:   Physical Exam  Constitutional: She is oriented to person, place, and time. She appears well-developed and well-nourished.  HENT:  Head: Normocephalic and atraumatic.  Eyes: EOM are normal.  Neck: Normal range of motion.  Cardiovascular: Normal rate and regular rhythm.  Pulmonary/Chest: Effort normal and breath sounds normal. No respiratory distress. She has no wheezes. She has no rales.  Abdominal: Soft. Bowel sounds are normal. She exhibits no distension. There is no tenderness. There is no rebound.  Musculoskeletal: She exhibits no edema.  Neurological: She is alert and oriented to person, place, and time. Coordination normal.  Skin: Skin is warm and dry.  Psychiatric: She has a normal mood and affect.   Vitals:   07/02/17 1002  BP: 110/80  Pulse: (!) 52  Temp: 97.6 F (36.4 C)  TempSrc: Oral  SpO2: 99%  Weight: 158 lb (71.7 kg)  Height: 5\' 5"  (1.651 m)      Assessment & Plan:

## 2017-07-02 NOTE — Patient Instructions (Signed)

## 2017-07-02 NOTE — Assessment & Plan Note (Signed)
Flu shot yearly. Pneumonia complete. Shingrix started. Tetanus up to date. Colonoscopy up to date. Mammogram up to date, pap smear not indicated and dexa up to date. Counseled about sun safety and mole surveillance. Counseled about the dangers of distracted driving. Given 10 year screening recommendations.

## 2017-07-02 NOTE — Assessment & Plan Note (Signed)
Checking lipid panel. CV risk low enough to avoid statin currently.

## 2018-07-06 ENCOUNTER — Other Ambulatory Visit: Payer: Self-pay

## 2018-07-06 ENCOUNTER — Ambulatory Visit (INDEPENDENT_AMBULATORY_CARE_PROVIDER_SITE_OTHER): Payer: Medicare Other | Admitting: Internal Medicine

## 2018-07-06 ENCOUNTER — Other Ambulatory Visit (INDEPENDENT_AMBULATORY_CARE_PROVIDER_SITE_OTHER): Payer: Medicare Other

## 2018-07-06 ENCOUNTER — Encounter: Payer: Self-pay | Admitting: Internal Medicine

## 2018-07-06 VITALS — BP 140/82 | HR 69 | Temp 98.2°F | Ht 65.0 in | Wt 152.0 lb

## 2018-07-06 DIAGNOSIS — Z0000 Encounter for general adult medical examination without abnormal findings: Secondary | ICD-10-CM

## 2018-07-06 DIAGNOSIS — E785 Hyperlipidemia, unspecified: Secondary | ICD-10-CM

## 2018-07-06 DIAGNOSIS — K581 Irritable bowel syndrome with constipation: Secondary | ICD-10-CM | POA: Diagnosis not present

## 2018-07-06 LAB — LIPID PANEL
Cholesterol: 285 mg/dL — ABNORMAL HIGH (ref 0–200)
HDL: 73.8 mg/dL (ref >39.00)
LDL Cholesterol: 189 mg/dL — ABNORMAL HIGH (ref 0–99)
NonHDL: 211.55
Total CHOL/HDL Ratio: 4
Triglycerides: 113 mg/dL (ref 0.0–149.0)
VLDL: 22.6 mg/dL (ref 0.0–40.0)

## 2018-07-06 LAB — COMPREHENSIVE METABOLIC PANEL
ALT: 12 U/L (ref 0–35)
AST: 23 U/L (ref 0–37)
Albumin: 4.4 g/dL (ref 3.5–5.2)
Alkaline Phosphatase: 58 U/L (ref 39–117)
BUN: 12 mg/dL (ref 6–23)
CO2: 26 mEq/L (ref 19–32)
Calcium: 9.7 mg/dL (ref 8.4–10.5)
Chloride: 103 mEq/L (ref 96–112)
Creatinine, Ser: 0.86 mg/dL (ref 0.40–1.20)
GFR: 65.51 mL/min (ref >60.00)
Glucose, Bld: 92 mg/dL (ref 70–99)
Potassium: 4.4 mEq/L (ref 3.5–5.1)
Sodium: 140 mEq/L (ref 135–145)
Total Bilirubin: 0.5 mg/dL (ref 0.2–1.2)
Total Protein: 7.3 g/dL (ref 6.0–8.3)

## 2018-07-06 LAB — CBC
HCT: 39.4 % (ref 36.0–46.0)
Hemoglobin: 13.5 g/dL (ref 12.0–15.0)
MCHC: 34.4 g/dL (ref 30.0–36.0)
MCV: 88.9 fl (ref 78.0–100.0)
Platelets: 232 10*3/uL (ref 150.0–400.0)
RBC: 4.43 Mil/uL (ref 3.87–5.11)
RDW: 13.2 % (ref 11.5–15.5)
WBC: 4.3 10*3/uL (ref 4.0–10.5)

## 2018-07-06 LAB — HEMOGLOBIN A1C: Hgb A1c MFr Bld: 5.4 % (ref 4.6–6.5)

## 2018-07-06 NOTE — Assessment & Plan Note (Signed)
Flu shot counseled. Pneumonia due 2021. Shingrix complete. Tetanus due 2027. Colonoscopy due 2024. Mammogram up to date due 2021, pap smear aged out and dexa due 2021. Counseled about sun safety and mole surveillance. Counseled about the dangers of distracted driving. Given 10 year screening recommendations.

## 2018-07-06 NOTE — Assessment & Plan Note (Signed)
Checking lipid panel and adjust as needed.  

## 2018-07-06 NOTE — Progress Notes (Signed)
   Subjective:   Patient ID: Angela Robbins, female    DOB: June 19, 1950, 68 y.o.   MRN: [id number]  HPI Here for medicare wellness and physical, no new complaints. Please see A/P for status and treatment of chronic medical problems.   Diet: heart healthy Physical activity: sedentary Depression/mood screen: negative Hearing: intact to whispered voice Visual acuity: grossly normal, overdue for annual eye exam  ADLs: capable Fall risk: none Home safety: good Cognitive evaluation: intact to orientation, naming, recall and repetition EOL planning: adv directives discussed    Office Visit from 07/06/2018 in [place]  PHQ-2 Total Score  1      I have personally reviewed and have noted 1. The patient's medical and social history - reviewed today no changes 2. Their use of alcohol, tobacco or illicit drugs 3. Their current medications and supplements 4. The patient's functional ability including ADL's, fall risks, home safety risks and hearing or visual impairment. 5. Diet and physical activities 6. Evidence for depression or mood disorders 7. Care team reviewed and updated  Patient Care Team: Hoyt Koch, MD as PCP - General (Internal Medicine) Gatha Mayer, MD as Consulting Physician (Gastroenterology) Past Medical History:  Diagnosis Date  . Allergy   . 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 Hyperlipidemia   . IBS (irritable bowel syndrome)   . Tubular adenoma 08/26/2007   Past Surgical History:  Procedure Laterality Date  . BREAST BIOPSY Right    benign  . COLONOSCOPY  08/26/2007; 2014   Family History  Problem Relation Age of Onset  . Hypertension Mother   . Cancer Mother   . Heart disease Father   . Diabetes Father   . Colon cancer Neg Hx     Review of Systems  Constitutional: Negative.   HENT: Negative.   Eyes: Negative.   Respiratory: Negative for cough, chest tightness and shortness of breath.   Cardiovascular: Negative for chest  pain, palpitations and leg swelling.  Gastrointestinal: Negative for abdominal distention, abdominal pain, constipation, diarrhea, nausea and vomiting.  Musculoskeletal: Negative.   Skin: Negative.   Neurological: Negative.   Psychiatric/Behavioral: Negative.     Objective:  Physical Exam Constitutional:      Appearance: She is well-developed.  HENT:     Head: Normocephalic and atraumatic.  Neck:     Musculoskeletal: Normal range of motion.  Cardiovascular:     Rate and Rhythm: Normal rate and regular rhythm.  Pulmonary:     Effort: Pulmonary effort is normal. No respiratory distress.     Breath sounds: Normal breath sounds. No wheezing or rales.  Abdominal:     General: Bowel sounds are normal. There is no distension.     Palpations: Abdomen is soft.     Tenderness: There is no abdominal tenderness. There is no rebound.  Skin:    General: Skin is warm and dry.  Neurological:     Mental Status: She is alert and oriented to person, place, and time.     Coordination: Coordination normal.     Vitals:   07/06/18 0907 07/06/18 0934  BP: (!) 140/92 140/82  Pulse: 69   Temp: 98.2 F (36.8 C)   TempSrc: Oral   SpO2: 98%   Weight: 152 lb (68.9 kg)   Height: 5\' 5"  (1.651 m)     Assessment & Plan:

## 2018-07-06 NOTE — Assessment & Plan Note (Signed)
Doing well with IB guard prn. Knows dietary triggers.

## 2018-07-06 NOTE — Patient Instructions (Signed)

## 2018-10-06 ENCOUNTER — Other Ambulatory Visit: Payer: Self-pay

## 2018-10-06 ENCOUNTER — Ambulatory Visit (INDEPENDENT_AMBULATORY_CARE_PROVIDER_SITE_OTHER): Payer: Medicare Other

## 2018-10-06 DIAGNOSIS — Z23 Encounter for immunization: Secondary | ICD-10-CM

## 2019-05-01 ENCOUNTER — Other Ambulatory Visit: Payer: Self-pay | Admitting: Internal Medicine

## 2019-05-01 DIAGNOSIS — Z1231 Encounter for screening mammogram for malignant neoplasm of breast: Secondary | ICD-10-CM

## 2019-06-01 ENCOUNTER — Other Ambulatory Visit: Payer: Self-pay

## 2019-06-01 ENCOUNTER — Ambulatory Visit
Admission: RE | Admit: 2019-06-01 | Discharge: 2019-06-01 | Disposition: A | Discharge status: Home / Self Care | Payer: Medicare Other | Source: Ambulatory Visit | Attending: Internal Medicine | Admitting: Internal Medicine

## 2019-06-01 DIAGNOSIS — Z1231 Encounter for screening mammogram for malignant neoplasm of breast: Secondary | ICD-10-CM

## 2019-07-10 ENCOUNTER — Ambulatory Visit (INDEPENDENT_AMBULATORY_CARE_PROVIDER_SITE_OTHER): Payer: Medicare Other | Admitting: Internal Medicine

## 2019-07-10 ENCOUNTER — Other Ambulatory Visit: Payer: Self-pay

## 2019-07-10 ENCOUNTER — Encounter: Payer: Self-pay | Admitting: Internal Medicine

## 2019-07-10 VITALS — BP 138/92 | HR 69 | Temp 98.3°F | Ht 65.0 in | Wt 141.0 lb

## 2019-07-10 DIAGNOSIS — Z0000 Encounter for general adult medical examination without abnormal findings: Secondary | ICD-10-CM

## 2019-07-10 DIAGNOSIS — E785 Hyperlipidemia, unspecified: Secondary | ICD-10-CM | POA: Diagnosis not present

## 2019-07-10 DIAGNOSIS — K581 Irritable bowel syndrome with constipation: Secondary | ICD-10-CM | POA: Diagnosis not present

## 2019-07-10 LAB — COMPREHENSIVE METABOLIC PANEL
ALT: 10 U/L (ref 0–35)
AST: 20 U/L (ref 0–37)
Albumin: 4.5 g/dL (ref 3.5–5.2)
Alkaline Phosphatase: 47 U/L (ref 39–117)
BUN: 13 mg/dL (ref 6–23)
CO2: 29 mEq/L (ref 19–32)
Calcium: 9.8 mg/dL (ref 8.4–10.5)
Chloride: 104 mEq/L (ref 96–112)
Creatinine, Ser: 0.9 mg/dL (ref 0.40–1.20)
GFR: 61.98 mL/min (ref >60.00)
Glucose, Bld: 93 mg/dL (ref 70–99)
Potassium: 4.4 mEq/L (ref 3.5–5.1)
Sodium: 139 mEq/L (ref 135–145)
Total Bilirubin: 0.6 mg/dL (ref 0.2–1.2)
Total Protein: 7.2 g/dL (ref 6.0–8.3)

## 2019-07-10 LAB — CBC
HCT: 38 % (ref 36.0–46.0)
Hemoglobin: 12.9 g/dL (ref 12.0–15.0)
MCHC: 34 g/dL (ref 30.0–36.0)
MCV: 89.9 fl (ref 78.0–100.0)
Platelets: 227 10*3/uL (ref 150.0–400.0)
RBC: 4.23 Mil/uL (ref 3.87–5.11)
RDW: 13.7 % (ref 11.5–15.5)
WBC: 4.6 10*3/uL (ref 4.0–10.5)

## 2019-07-10 LAB — LIPID PANEL
Cholesterol: 280 mg/dL — ABNORMAL HIGH (ref 0–200)
HDL: 76.2 mg/dL (ref >39.00)
LDL Cholesterol: 184 mg/dL — ABNORMAL HIGH (ref 0–99)
NonHDL: 203.5
Total CHOL/HDL Ratio: 4
Triglycerides: 98 mg/dL (ref 0.0–149.0)
VLDL: 19.6 mg/dL (ref 0.0–40.0)

## 2019-07-10 NOTE — Assessment & Plan Note (Signed)
With constipation predominant and has hemorrhoids and fissure. Using otc fiber and stool softener currently.

## 2019-07-10 NOTE — Assessment & Plan Note (Signed)
Flu shot due next season. Covid-19 up to date. Pneumonia complete. Shingrix counseled. Tetanus due 2027. Colonoscopy due 2024. Mammogram due 2023, pap smear aged out and dexa due 2022. Counseled about sun safety and mole surveillance. Counseled about the dangers of distracted driving. Given 10 year screening recommendations.

## 2019-07-10 NOTE — Progress Notes (Signed)
Subjective:   Patient ID: Angela Robbins, female    DOB: [birth year], 69 y.o.   MRN: [id number]  HPI Here for medicare wellness and physical, no new complaints. Please see A/P for status and treatment of chronic medical problems.   Diet: heart healthy  Physical activity: sedentary Depression/mood screen: negative Hearing: intact to whispered voice Visual acuity: grossly normal, performs annual eye exam  ADLs: capable Fall risk: none Home safety: good Cognitive evaluation: intact to orientation, naming, recall and repetition EOL planning: adv directives discussed    Office Visit from 07/10/2019 in [place] at [facility] Total Score 0      I have personally reviewed and have noted 1. The patient's medical and social history - reviewed today no changes 2. Their use of alcohol, tobacco or illicit drugs 3. Their current medications and supplements 4. The patient's functional ability including ADL's, fall risks, home safety risks and hearing or visual impairment. 5. Diet and physical activities 6. Evidence for depression or mood disorders 7. Care team reviewed and updated  Patient Care Team: Hoyt Koch, MD as PCP - General (Internal Medicine) Gatha Mayer, MD as Consulting Physician (Gastroenterology) Past Medical History:  Diagnosis Date  . Allergy   . 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 Hyperlipidemia   . IBS (irritable bowel syndrome)   . Tubular adenoma 08/26/2007   Past Surgical History:  Procedure Laterality Date  . BREAST BIOPSY Right    benign  . COLONOSCOPY  08/26/2007; 2014   Family History  Problem Relation Age of Onset  . Hypertension Mother   . Cancer Mother   . Heart disease Father   . Diabetes Father   . Colon cancer Neg Hx    Review of Systems  Constitutional: Negative.   HENT: Negative.   Eyes: Negative.   Respiratory: Negative for cough, chest tightness and shortness of breath.   Cardiovascular: Negative for chest pain,  palpitations and leg swelling.  Gastrointestinal: Negative for abdominal distention, abdominal pain, constipation, diarrhea, nausea and vomiting.  Musculoskeletal: Negative.   Skin: Negative.   Neurological: Negative.   Psychiatric/Behavioral: Negative.     Objective:  Physical Exam Constitutional:      Appearance: She is well-developed.  HENT:     Head: Normocephalic and atraumatic.  Cardiovascular:     Rate and Rhythm: Normal rate and regular rhythm.  Pulmonary:     Effort: Pulmonary effort is normal. No respiratory distress.     Breath sounds: Normal breath sounds. No wheezing or rales.  Abdominal:     General: Bowel sounds are normal. There is no distension.     Palpations: Abdomen is soft.     Tenderness: There is no abdominal tenderness. There is no rebound.  Musculoskeletal:     Cervical back: Normal range of motion.  Skin:    General: Skin is warm and dry.  Neurological:     Mental Status: She is alert and oriented to person, place, and time.     Coordination: Coordination normal.     Vitals:   07/10/19 1044  BP: (!) 138/92  Pulse: 69  Temp: 98.3 F (36.8 C)  TempSrc: Oral  SpO2: 97%  Weight: 141 lb (64 kg)  Height: 5\' 5"  (1.651 m)   This visit occurred during the SARS-CoV-2 public health emergency.  Safety protocols were in place, including screening questions prior to the visit, additional usage of staff PPE, and extensive cleaning of exam room while observing appropriate contact time  as indicated for disinfecting solutions.   Assessment & Plan:

## 2019-07-10 NOTE — Assessment & Plan Note (Signed)
Checking lipid panel and adjust as needed. No meds currently.

## 2019-07-10 NOTE — Patient Instructions (Signed)
Health Maintenance, Female Adopting a healthy lifestyle and getting preventive care are important in promoting health and wellness. Ask your health care provider about:  The right schedule for you to have regular tests and exams.  Things you can do on your own to prevent diseases and keep yourself healthy. What should I know about diet, weight, and exercise? Eat a healthy diet   Eat a diet that includes plenty of vegetables, fruits, low-fat dairy products, and lean protein.  Do not eat a lot of foods that are high in solid fats, added sugars, or sodium. Maintain a healthy weight Body mass index (BMI) is used to identify weight problems. It estimates body fat based on height and weight. Your health care provider can help determine your BMI and help you achieve or maintain a healthy weight. Get regular exercise Get regular exercise. This is one of the most important things you can do for your health. Most adults should:  Exercise for at least 150 minutes each week. The exercise should increase your heart rate and make you sweat (moderate-intensity exercise).  Do strengthening exercises at least twice a week. This is in addition to the moderate-intensity exercise.  Spend less time sitting. Even light physical activity can be beneficial. Watch cholesterol and blood lipids Have your blood tested for lipids and cholesterol at 70 years of age, then have this test every 5 years. Have your cholesterol levels checked more often if:  Your lipid or cholesterol levels are high.  You are older than 70 years of age.  You are at high risk for heart disease. What should I know about cancer screening? Depending on your health history and family history, you may need to have cancer screening at various ages. This may include screening for:  Breast cancer.  Cervical cancer.  Colorectal cancer.  Skin cancer.  Lung cancer. What should I know about heart disease, diabetes, and high blood  pressure? Blood pressure and heart disease  High blood pressure causes heart disease and increases the risk of stroke. This is more likely to develop in people who have high blood pressure readings, are of African descent, or are overweight.  Have your blood pressure checked: ? Every 3-5 years if you are 18-39 years of age. ? Every year if you are 40 years old or older. Diabetes Have regular diabetes screenings. This checks your fasting blood sugar level. Have the screening done:  Once every three years after age 40 if you are at a normal weight and have a low risk for diabetes.  More often and at a younger age if you are overweight or have a high risk for diabetes. What should I know about preventing infection? Hepatitis B If you have a higher risk for hepatitis B, you should be screened for this virus. Talk with your health care provider to find out if you are at risk for hepatitis B infection. Hepatitis C Testing is recommended for:  Everyone born from 1945 through 1965.  Anyone with known risk factors for hepatitis C. Sexually transmitted infections (STIs)  Get screened for STIs, including gonorrhea and chlamydia, if: ? You are sexually active and are younger than 70 years of age. ? You are older than 70 years of age and your health care provider tells you that you are at risk for this type of infection. ? Your sexual activity has changed since you were last screened, and you are at increased risk for chlamydia or gonorrhea. Ask your health care provider if   you are at risk.  Ask your health care provider about whether you are at high risk for HIV. Your health care provider may recommend a prescription medicine to help prevent HIV infection. If you choose to take medicine to prevent HIV, you should first get tested for HIV. You should then be tested every 3 months for as long as you are taking the medicine. Pregnancy  If you are about to stop having your period (premenopausal) and  you may become pregnant, seek counseling before you get pregnant.  Take 400 to 800 micrograms (mcg) of folic acid every day if you become pregnant.  Ask for birth control (contraception) if you want to prevent pregnancy. Osteoporosis and menopause Osteoporosis is a disease in which the bones lose minerals and strength with aging. This can result in bone fractures. If you are 65 years old or older, or if you are at risk for osteoporosis and fractures, ask your health care provider if you should:  Be screened for bone loss.  Take a calcium or vitamin D supplement to lower your risk of fractures.  Be given hormone replacement therapy (HRT) to treat symptoms of menopause. Follow these instructions at home: Lifestyle  Do not use any products that contain nicotine or tobacco, such as cigarettes, e-cigarettes, and chewing tobacco. If you need help quitting, ask your health care provider.  Do not use street drugs.  Do not share needles.  Ask your health care provider for help if you need support or information about quitting drugs. Alcohol use  Do not drink alcohol if: ? Your health care provider tells you not to drink. ? You are pregnant, may be pregnant, or are planning to become pregnant.  If you drink alcohol: ? Limit how much you use to 0-1 drink a day. ? Limit intake if you are breastfeeding.  Be aware of how much alcohol is in your drink. In the [place] one drink equals one 12 oz bottle of beer (355 mL), one 5 oz glass of wine (148 mL), or one 1 oz glass of hard liquor (44 mL). General instructions  Schedule regular health, dental, and eye exams.  Stay current with your vaccines.  Tell your health care provider if: ? You often feel depressed. ? You have ever been abused or do not feel safe at home. Summary  Adopting a healthy lifestyle and getting preventive care are important in promoting health and wellness.  Follow your health care provider's instructions about healthy  diet, exercising, and getting tested or screened for diseases.  Follow your health care provider's instructions on monitoring your cholesterol and blood pressure. This information is not intended to replace advice given to you by your health care provider. Make sure you discuss any questions you have with your health care provider. Document Revised: 12/29/2017 Document Reviewed: 12/29/2017 Elsevier Patient Education  2020 [employer] Inc.  

## 2020-07-12 ENCOUNTER — Encounter: Payer: Medicare Other | Admitting: Internal Medicine

## 2020-07-17 ENCOUNTER — Ambulatory Visit (INDEPENDENT_AMBULATORY_CARE_PROVIDER_SITE_OTHER): Payer: Medicare Other | Admitting: Internal Medicine

## 2020-07-17 ENCOUNTER — Other Ambulatory Visit: Payer: Self-pay

## 2020-07-17 ENCOUNTER — Encounter: Payer: Self-pay | Admitting: Internal Medicine

## 2020-07-17 VITALS — BP 128/82 | HR 53 | Temp 98.5°F | Resp 18 | Ht 65.0 in | Wt 134.4 lb

## 2020-07-17 DIAGNOSIS — K582 Mixed irritable bowel syndrome: Secondary | ICD-10-CM

## 2020-07-17 DIAGNOSIS — E785 Hyperlipidemia, unspecified: Secondary | ICD-10-CM

## 2020-07-17 DIAGNOSIS — Z23 Encounter for immunization: Secondary | ICD-10-CM | POA: Diagnosis not present

## 2020-07-17 DIAGNOSIS — Z0000 Encounter for general adult medical examination without abnormal findings: Secondary | ICD-10-CM | POA: Diagnosis not present

## 2020-07-17 LAB — COMPREHENSIVE METABOLIC PANEL WITH GFR
ALT: 11 U/L (ref 0–35)
AST: 23 U/L (ref 0–37)
Albumin: 4.3 g/dL (ref 3.5–5.2)
Alkaline Phosphatase: 44 U/L (ref 39–117)
BUN: 15 mg/dL (ref 6–23)
CO2: 29 meq/L (ref 19–32)
Calcium: 9.5 mg/dL (ref 8.4–10.5)
Chloride: 103 meq/L (ref 96–112)
Creatinine, Ser: 0.91 mg/dL (ref 0.40–1.20)
GFR: 63.88 mL/min
Glucose, Bld: 87 mg/dL (ref 70–99)
Potassium: 4.2 meq/L (ref 3.5–5.1)
Sodium: 140 meq/L (ref 135–145)
Total Bilirubin: 0.8 mg/dL (ref 0.2–1.2)
Total Protein: 7 g/dL (ref 6.0–8.3)

## 2020-07-17 LAB — CBC
HCT: 36.1 % (ref 36.0–46.0)
Hemoglobin: 12.8 g/dL (ref 12.0–15.0)
MCHC: 35.3 g/dL (ref 30.0–36.0)
MCV: 87.7 fl (ref 78.0–100.0)
Platelets: 220 10*3/uL (ref 150.0–400.0)
RBC: 4.12 Mil/uL (ref 3.87–5.11)
RDW: 13.5 % (ref 11.5–15.5)
WBC: 4 10*3/uL (ref 4.0–10.5)

## 2020-07-17 LAB — LIPID PANEL
Cholesterol: 276 mg/dL — ABNORMAL HIGH (ref 0–200)
HDL: 78.1 mg/dL
LDL Cholesterol: 184 mg/dL — ABNORMAL HIGH (ref 0–99)
NonHDL: 198.16
Total CHOL/HDL Ratio: 4
Triglycerides: 72 mg/dL (ref 0.0–149.0)
VLDL: 14.4 mg/dL (ref 0.0–40.0)

## 2020-07-17 NOTE — Progress Notes (Signed)
Subjective:   Patient ID: Angela Robbins, female    DOB: [birth year], 70 y.o.   MRN: [id number]  HPI Here for medicare wellness and physical, no new complaints. Please see A/P for status and treatment of chronic medical problems.   Diet: heart healthy Physical activity: sedentary Depression/mood screen: negative Hearing: intact to whispered voice Visual acuity: grossly normal, performs annual eye exam  ADLs: capable Fall risk: none Home safety: good Cognitive evaluation: intact to orientation, naming, recall and repetition EOL planning: adv directives discussed  [place] Visit from 07/17/2020 in [place] at [facility] Total Score 0       I have personally reviewed and have noted 1. The patient's medical and social history - reviewed today no changes 2. Their use of alcohol, tobacco or illicit drugs 3. Their current medications and supplements 4. The patient's functional ability including ADL's, fall risks, home safety risks and hearing or visual impairment. 5. Diet and physical activities 6. Evidence for depression or mood disorders 7. Care team reviewed and updated 8.  The patient is not on an opioid pain medication.  Patient Care Team: Hoyt Koch, MD as PCP - General (Internal Medicine) Gatha Mayer, MD as Consulting Physician (Gastroenterology) Past Medical History:  Diagnosis Date   Allergy    Anal fissure 09/27/2012   Hyperlipidemia    IBS (irritable bowel syndrome)    Tubular adenoma 08/26/2007   Past Surgical History:  Procedure Laterality Date   BREAST BIOPSY Right    benign   COLONOSCOPY  08/26/2007; 2014   Family History  Problem Relation Age of Onset   Hypertension Mother    Cancer Mother    Heart disease Father    Diabetes Father    Colon cancer Neg Hx    Review of Systems  Constitutional: Negative.   HENT: Negative.    Eyes: Negative.   Respiratory:  Negative for cough, chest tightness and shortness  of breath.   Cardiovascular:  Negative for chest pain, palpitations and leg swelling.  Gastrointestinal:  Negative for abdominal distention, abdominal pain, constipation, diarrhea, nausea and vomiting.  Musculoskeletal: Negative.   Skin: Negative.   Neurological: Negative.   Psychiatric/Behavioral: Negative.     Objective:  Physical Exam Constitutional:      Appearance: She is well-developed.  HENT:     Head: Normocephalic and atraumatic.  Cardiovascular:     Rate and Rhythm: Normal rate and regular rhythm.  Pulmonary:     Effort: Pulmonary effort is normal. No respiratory distress.     Breath sounds: Normal breath sounds. No wheezing or rales.  Abdominal:     General: Bowel sounds are normal. There is no distension.     Palpations: Abdomen is soft.     Tenderness: There is no abdominal tenderness. There is no rebound.  Musculoskeletal:     Cervical back: Normal range of motion.  Skin:    General: Skin is warm and dry.  Neurological:     Mental Status: She is alert and oriented to person, place, and time.     Coordination: Coordination normal.    Vitals:   07/17/20 0851  BP: 128/82  Pulse: (!) 53  Resp: 18  Temp: 98.5 F (36.9 C)  TempSrc: Oral  SpO2: 99%  Weight: 134 lb 6.4 oz (61 kg)  Height: 5\' 5"  (1.651 m)    This visit occurred during the SARS-CoV-2 public health emergency.  Safety protocols were in place, including screening questions  prior to the visit, additional usage of staff PPE, and extensive cleaning of exam room while observing appropriate contact time as indicated for disinfecting solutions.   Assessment & Plan:  Pneumonia 23 given at visit

## 2020-07-17 NOTE — Assessment & Plan Note (Signed)
Flu shot yearly. Covid-19 3 shots counseled about 4th. Pneumonia given 23 to complete series today. Shingrix complete. Tetanus due 2026. Colonoscopy due 2024. Mammogram due 2023, pap smear aged out and dexa done 2017. Counseled about sun safety and mole surveillance. Counseled about the dangers of distracted driving. Given 10 year screening recommendations.

## 2020-07-17 NOTE — Assessment & Plan Note (Signed)
Checking cholesterol. Not on medications currently. Is active.

## 2020-07-17 NOTE — Patient Instructions (Addendum)
I would try more hydration and magnesium for the muscle cramps.

## 2020-07-17 NOTE — Assessment & Plan Note (Signed)
Stable overall and up to date on colon cancer screening.

## 2020-08-28 ENCOUNTER — Encounter: Payer: Self-pay | Admitting: Internal Medicine

## 2020-09-05 LAB — IFOBT (OCCULT BLOOD): IFOBT: NEGATIVE

## 2020-11-11 ENCOUNTER — Other Ambulatory Visit: Payer: Self-pay | Admitting: Internal Medicine

## 2020-11-11 DIAGNOSIS — Z1231 Encounter for screening mammogram for malignant neoplasm of breast: Secondary | ICD-10-CM

## 2020-11-29 ENCOUNTER — Other Ambulatory Visit: Payer: Self-pay

## 2020-11-29 ENCOUNTER — Ambulatory Visit
Admission: RE | Admit: 2020-11-29 | Discharge: 2020-11-29 | Disposition: A | Discharge status: Home / Self Care | Payer: Medicare Other | Source: Ambulatory Visit | Attending: Internal Medicine | Admitting: Internal Medicine

## 2020-11-29 DIAGNOSIS — Z1231 Encounter for screening mammogram for malignant neoplasm of breast: Secondary | ICD-10-CM | POA: Diagnosis present

## 2021-04-07 ENCOUNTER — Telehealth: Payer: Self-pay | Admitting: Internal Medicine

## 2021-04-07 NOTE — Telephone Encounter (Signed)
Can talk about if this is needed at upcoming apt. Is there a need for this sooner? ?

## 2021-04-07 NOTE — Telephone Encounter (Signed)
Pt requesting a bone density scan ? ?Pt states her last scan was over 5 years ago, please call pt when scan is ready for scheduling  ?

## 2021-07-15 ENCOUNTER — Encounter: Payer: Medicare Other | Admitting: Internal Medicine

## 2021-07-24 ENCOUNTER — Ambulatory Visit (INDEPENDENT_AMBULATORY_CARE_PROVIDER_SITE_OTHER): Payer: Medicare Other | Admitting: Internal Medicine

## 2021-07-24 ENCOUNTER — Encounter: Payer: Self-pay | Admitting: Internal Medicine

## 2021-07-24 VITALS — BP 112/68 | HR 57 | Resp 18 | Ht 65.0 in | Wt 135.0 lb

## 2021-07-24 DIAGNOSIS — K582 Mixed irritable bowel syndrome: Secondary | ICD-10-CM | POA: Diagnosis not present

## 2021-07-24 DIAGNOSIS — Z0000 Encounter for general adult medical examination without abnormal findings: Secondary | ICD-10-CM

## 2021-07-24 DIAGNOSIS — R5383 Other fatigue: Secondary | ICD-10-CM | POA: Diagnosis not present

## 2021-07-24 DIAGNOSIS — E785 Hyperlipidemia, unspecified: Secondary | ICD-10-CM | POA: Diagnosis not present

## 2021-07-24 LAB — COMPREHENSIVE METABOLIC PANEL
ALT: 11 U/L (ref 0–35)
AST: 24 U/L (ref 0–37)
Albumin: 4.4 g/dL (ref 3.5–5.2)
Alkaline Phosphatase: 45 U/L (ref 39–117)
BUN: 12 mg/dL (ref 6–23)
CO2: 30 mEq/L (ref 19–32)
Calcium: 9.9 mg/dL (ref 8.4–10.5)
Chloride: 102 mEq/L (ref 96–112)
Creatinine, Ser: 0.9 mg/dL (ref 0.40–1.20)
GFR: 64.27 mL/min (ref >60.00)
Glucose, Bld: 92 mg/dL (ref 70–99)
Potassium: 4.4 mEq/L (ref 3.5–5.1)
Sodium: 139 mEq/L (ref 135–145)
Total Bilirubin: 0.6 mg/dL (ref 0.2–1.2)
Total Protein: 7 g/dL (ref 6.0–8.3)

## 2021-07-24 LAB — LIPID PANEL
Cholesterol: 284 mg/dL — ABNORMAL HIGH (ref 0–200)
HDL: 79.8 mg/dL (ref >39.00)
LDL Cholesterol: 188 mg/dL — ABNORMAL HIGH (ref 0–99)
NonHDL: 204.52
Total CHOL/HDL Ratio: 4
Triglycerides: 84 mg/dL (ref 0.0–149.0)
VLDL: 16.8 mg/dL (ref 0.0–40.0)

## 2021-07-24 LAB — VITAMIN B12: Vitamin B-12: 318 pg/mL (ref 211–911)

## 2021-07-24 LAB — CBC
HCT: 36.8 % (ref 36.0–46.0)
Hemoglobin: 12.7 g/dL (ref 12.0–15.0)
MCHC: 34.6 g/dL (ref 30.0–36.0)
MCV: 89.2 fl (ref 78.0–100.0)
Platelets: 215 10*3/uL (ref 150.0–400.0)
RBC: 4.12 Mil/uL (ref 3.87–5.11)
RDW: 13.7 % (ref 11.5–15.5)
WBC: 4.9 10*3/uL (ref 4.0–10.5)

## 2021-07-24 LAB — VITAMIN D 25 HYDROXY (VIT D DEFICIENCY, FRACTURES): VITD: 63.85 ng/mL (ref 30.00–100.00)

## 2021-07-24 LAB — TSH: TSH: 4.01 u[IU]/mL (ref 0.35–5.50)

## 2021-07-24 NOTE — Assessment & Plan Note (Signed)
Checking TSH and vitamin D and vitamin B12. Treat as appropriate. Checking CBC and CMP as well.

## 2021-07-24 NOTE — Assessment & Plan Note (Signed)
With constipation and using diet and otc for relief.

## 2021-07-24 NOTE — Progress Notes (Signed)
Subjective:   Patient ID: Angela Robbins, female    DOB: 07-13-1950, 71 y.o.   MRN: [id number]  HPI Here for medicare wellness and physical, no new complaints. Please see A/P for status and treatment of chronic medical problems.   Diet: heart healthy Physical activity: sedentary Depression/mood screen: negative Hearing: intact to whispered voice Visual acuity: grossly normal, performs annual eye exam  ADLs: capable Fall risk: none Home safety: good Cognitive evaluation: intact to orientation, naming, recall and repetition EOL planning: adv directives discussed  [place] Visit from 07/24/2021 in [place] at [place] Office Visit from 07/24/2021 in [place] at [facility]  PHQ-9 Total Score 2         10/15/2015    9:05 AM 07/10/2016    9:04 AM 07/10/2019   10:47 AM 07/17/2020    9:00 AM 07/24/2021   10:09 AM  Fall Risk  Falls in the past year? No No 0 0 0  Was there an injury with Fall?    0 0  Fall Risk Category Calculator    0 0  Fall Risk Category    Low Low    I have personally reviewed and have noted 1. The patient's medical and social history - reviewed today no changes 2. Their use of alcohol, tobacco or illicit drugs 3. Their current medications and supplements 4. The patient's functional ability including ADL's, fall risks, home safety risks and hearing or visual impairment. 5. Diet and physical activities 6. Evidence for depression or mood disorders 7. Care team reviewed and updated 8.  The patient is not on an opioid pain medication  Patient Care Team: Hoyt Koch, MD as PCP - General (Internal Medicine) Gatha Mayer, MD as Consulting Physician (Gastroenterology) Past Medical History:  Diagnosis Date   Allergy    Anal fissure 09/27/2012   Hyperlipidemia    IBS (irritable bowel syndrome)    Tubular adenoma 08/26/2007   Past Surgical History:  Procedure Laterality  Date   BREAST BIOPSY Right 2004   benign, fibroadenoma   BREAST CYST ASPIRATION Right    COLONOSCOPY  08/26/2007; 2014   Family History  Problem Relation Age of Onset   Hypertension Mother    Cancer Mother    Heart disease Father    Diabetes Father    Colon cancer Neg Hx    Review of Systems  Constitutional: Negative.   HENT: Negative.    Eyes: Negative.   Respiratory:  Negative for cough, chest tightness and shortness of breath.   Cardiovascular:  Negative for chest pain, palpitations and leg swelling.  Gastrointestinal:  Negative for abdominal distention, abdominal pain, constipation, diarrhea, nausea and vomiting.  Musculoskeletal: Negative.   Skin: Negative.   Neurological: Negative.   Psychiatric/Behavioral: Negative.      Objective:  Physical Exam Constitutional:      Appearance: She is well-developed.  HENT:     Head: Normocephalic and atraumatic.  Cardiovascular:     Rate and Rhythm: Normal rate and regular rhythm.  Pulmonary:     Effort: Pulmonary effort is normal. No respiratory distress.     Breath sounds: Normal breath sounds. No wheezing or rales.  Abdominal:     General: Bowel sounds are normal. There is no distension.     Palpations: Abdomen is soft.     Tenderness: There is no abdominal tenderness. There is no rebound.  Musculoskeletal:  Cervical back: Normal range of motion.  Skin:    General: Skin is warm and dry.  Neurological:     Mental Status: She is alert and oriented to person, place, and time.     Coordination: Coordination normal.     Vitals:   07/24/21 1005  BP: 112/68  Pulse: (!) 57  Resp: 18  SpO2: 97%  Weight: 135 lb (61.2 kg)  Height: '5\' 5"'$  (1.651 m)    Assessment & Plan:

## 2021-07-24 NOTE — Assessment & Plan Note (Signed)
Checking lipid panel and adjust as needed.  

## 2021-07-24 NOTE — Assessment & Plan Note (Signed)
Flu shot yearly. Covid-19 counseled. Pneumonia complete. Shingrix complete. Tetanus due 2027. Colonoscopy due 2024. Mammogram due 2024, pap smear aged out and dexa complete. Counseled about sun safety and mole surveillance. Counseled about the dangers of distracted driving. Given 10 year screening recommendations.

## 2021-07-24 NOTE — Patient Instructions (Signed)
Get the covid-19 booster and flu shot this fall.

## 2021-11-03 ENCOUNTER — Telehealth: Payer: Self-pay | Admitting: Internal Medicine

## 2021-11-03 ENCOUNTER — Other Ambulatory Visit: Payer: Self-pay | Admitting: Internal Medicine

## 2021-11-03 DIAGNOSIS — Z1231 Encounter for screening mammogram for malignant neoplasm of breast: Secondary | ICD-10-CM

## 2021-11-03 NOTE — Telephone Encounter (Signed)
Good Afternoon Dr. Carlean Purl,   Patient called stating that her PCP would like her to have her colonoscopy done this year due to her age. I advised patient that she is not due until November of 2024. Patient stated that she will be 85 then and does not think it is a good idea for her to have a colonoscopy at that age. Will you please review and advise on scheduling patient?   Thank you

## 2021-11-04 NOTE — Telephone Encounter (Signed)
The recommendation for 2024 is/was based upon guidelines from experts.  She may schedule an OV me to discuss if desired.

## 2021-11-07 NOTE — Telephone Encounter (Signed)
Spoke with patient and advised her that she was not due until 2024 and offered for her to come in to discuss more information if needed with Dr. Carlean Purl. Patient stated she did not see the reason for a office visit and would possibly look to see if another gastro provider would do the procedure.

## 2021-12-03 ENCOUNTER — Ambulatory Visit
Admission: RE | Admit: 2021-12-03 | Discharge: 2021-12-03 | Disposition: A | Discharge status: Home / Self Care | Payer: Medicare Other | Source: Ambulatory Visit | Attending: Internal Medicine | Admitting: Internal Medicine

## 2021-12-03 DIAGNOSIS — Z1231 Encounter for screening mammogram for malignant neoplasm of breast: Secondary | ICD-10-CM | POA: Diagnosis present

## 2022-08-06 ENCOUNTER — Encounter: Payer: Medicare Other | Admitting: Internal Medicine

## 2022-08-07 ENCOUNTER — Ambulatory Visit (INDEPENDENT_AMBULATORY_CARE_PROVIDER_SITE_OTHER): Payer: Medicare Other | Admitting: Internal Medicine

## 2022-08-07 ENCOUNTER — Encounter: Payer: Self-pay | Admitting: Internal Medicine

## 2022-08-07 VITALS — BP 124/80 | HR 58 | Temp 98.2°F | Ht 65.0 in | Wt 136.1 lb

## 2022-08-07 DIAGNOSIS — K582 Mixed irritable bowel syndrome: Secondary | ICD-10-CM

## 2022-08-07 DIAGNOSIS — Z0000 Encounter for general adult medical examination without abnormal findings: Secondary | ICD-10-CM

## 2022-08-07 DIAGNOSIS — E785 Hyperlipidemia, unspecified: Secondary | ICD-10-CM | POA: Diagnosis not present

## 2022-08-07 NOTE — Progress Notes (Signed)
Subjective:   Patient ID: Angela Robbins, female    DOB: 03-Feb-1950, 72 y.o.   MRN: [id number]  HPI Here for medicare wellness and physical, no new complaints. Please see A/P for status and treatment of chronic medical problems.   Diet: heart healthy  Physical activity: active Depression/mood screen: negative Hearing: intact to whispered voice Visual acuity: grossly normal, performs annual eye exam  ADLs: capable Fall risk: none Home safety: good Cognitive evaluation: intact to orientation, naming, recall and repetition EOL planning: adv directives discussed  Flowsheet Row Office Visit from 08/07/2022 in [facility] [place]  PHQ-2 Total Score 0       Flowsheet Row Office Visit from 08/07/2022 in [facility]  PHQ-9 Total Score 0         07/10/2016    9:04 AM 07/10/2019   10:47 AM 07/17/2020    9:00 AM 07/24/2021   10:09 AM 08/07/2022    9:52 AM  Fall Risk  Falls in the past year? No 0 0 0 0  Was there an injury with Fall?   0 0 0  Fall Risk Category Calculator   0 0 0  Fall Risk Category (Retired)   Low Low   Fall risk Follow up     Falls evaluation completed   I have personally reviewed and have noted 1. The patient's medical and social history - reviewed today no changes 2. Their use of alcohol, tobacco or illicit drugs 3. Their current medications and supplements 4. The patient's functional ability including ADL's, fall risks, home safety risks and hearing or visual impairment. 5. Diet and physical activities 6. Evidence for depression or mood disorders 7. Care team reviewed and updated 8.  The patient is not on an opioid pain medication.   Patient Care Team: Myrlene Broker, MD as PCP - General (Internal Medicine) Iva Boop, MD as Consulting Physician (Gastroenterology) Past Medical History:  Diagnosis Date   Allergy    Anal fissure 09/27/2012   Hyperlipidemia    IBS (irritable bowel  syndrome)    Tubular adenoma 08/26/2007   Past Surgical History:  Procedure Laterality Date   BREAST BIOPSY Right 2004   benign, fibroadenoma   BREAST CYST ASPIRATION Right    COLONOSCOPY  08/26/2007; 2014   Family History  Problem Relation Age of Onset   Hypertension Mother    Cancer Mother    Heart disease Father    Diabetes Father    Colon cancer Neg Hx    Breast cancer Neg Hx       Review of Systems  Constitutional: Negative.   HENT: Negative.    Eyes: Negative.   Respiratory:  Negative for cough, chest tightness and shortness of breath.   Cardiovascular:  Negative for chest pain, palpitations and leg swelling.  Gastrointestinal:  Negative for abdominal distention, abdominal pain, constipation, diarrhea, nausea and vomiting.  Musculoskeletal: Negative.   Skin: Negative.   Neurological: Negative.   Psychiatric/Behavioral: Negative.      Objective:  Physical Exam Constitutional:      Appearance: She is well-developed.  HENT:     Head: Normocephalic and atraumatic.  Cardiovascular:     Rate and Rhythm: Normal rate and regular rhythm.  Pulmonary:     Effort: Pulmonary effort is normal. No respiratory distress.     Breath sounds: Normal breath sounds. No wheezing or rales.  Abdominal:     General: Bowel sounds are normal. There  is no distension.     Palpations: Abdomen is soft.     Tenderness: There is no abdominal tenderness. There is no rebound.  Musculoskeletal:     Cervical back: Normal range of motion.  Skin:    General: Skin is warm and dry.  Neurological:     Mental Status: She is alert and oriented to person, place, and time.     Coordination: Coordination normal.     Vitals:   08/07/22 0948  BP: 124/80  Pulse: (!) 58  Temp: 98.2 F (36.8 C)  TempSrc: Oral  SpO2: 99%  Weight: 136 lb 2 oz (61.7 kg)  Height: 5\' 5"  (1.651 m)    Assessment & Plan:

## 2022-08-07 NOTE — Assessment & Plan Note (Signed)
Flu shot yearly. Pneumonia complete. Shingrix complete. Tetanus due 2027. Colonoscopy due 2024. Mammogram due 2025, pap smear aged out and dexa complete. Counseled about sun safety and mole surveillance. Counseled about the dangers of distracted driving. Given 10 year screening recommendations.

## 2022-08-07 NOTE — Assessment & Plan Note (Signed)
Overall stable and she does use diet to help control.

## 2022-08-07 NOTE — Assessment & Plan Note (Signed)
Checking lipid panel and CMP and CBC and adjust as needed.

## 2022-08-08 LAB — LIPID PANEL
Cholesterol: 288 mg/dL — ABNORMAL HIGH (ref <200)
HDL: 95 mg/dL (ref >=50)
LDL Cholesterol (Calc): 177 mg/dL (calc) — ABNORMAL HIGH
Non-HDL Cholesterol (Calc): 193 mg/dL (calc) — ABNORMAL HIGH (ref <130)
Total CHOL/HDL Ratio: 3 (calc) (ref <5.0)
Triglycerides: 60 mg/dL (ref <150)

## 2022-08-08 LAB — COMPREHENSIVE METABOLIC PANEL
AG Ratio: 1.8 (calc) (ref 1.0–2.5)
ALT: 9 U/L (ref 6–29)
AST: 23 U/L (ref 10–35)
Albumin: 4.4 g/dL (ref 3.6–5.1)
Alkaline phosphatase (APISO): 54 U/L (ref 37–153)
BUN: 13 mg/dL (ref 7–25)
CO2: 27 mmol/L (ref 20–32)
Calcium: 9.7 mg/dL (ref 8.6–10.4)
Chloride: 104 mmol/L (ref 98–110)
Creat: 0.88 mg/dL (ref 0.60–1.00)
Globulin: 2.4 g/dL (calc) (ref 1.9–3.7)
Glucose, Bld: 85 mg/dL (ref 65–99)
Potassium: 5 mmol/L (ref 3.5–5.3)
Sodium: 139 mmol/L (ref 135–146)
Total Bilirubin: 0.7 mg/dL (ref 0.2–1.2)
Total Protein: 6.8 g/dL (ref 6.1–8.1)

## 2022-08-08 LAB — CBC
HCT: 37.6 % (ref 35.0–45.0)
Hemoglobin: 12.8 g/dL (ref 11.7–15.5)
MCH: 30.7 pg (ref 27.0–33.0)
MCHC: 34 g/dL (ref 32.0–36.0)
MCV: 90.2 fL (ref 80.0–100.0)
MPV: 11.8 fL (ref 7.5–12.5)
Platelets: 255 10*3/uL (ref 140–400)
RBC: 4.17 10*6/uL (ref 3.80–5.10)
RDW: 12.5 % (ref 11.0–15.0)
WBC: 4.2 10*3/uL (ref 3.8–10.8)

## 2022-08-08 LAB — VITAMIN D 25 HYDROXY (VIT D DEFICIENCY, FRACTURES): Vit D, 25-Hydroxy: 47 ng/mL (ref 30–100)

## 2022-09-10 ENCOUNTER — Encounter: Payer: Self-pay | Admitting: Internal Medicine

## 2022-09-26 IMAGING — MG MM DIGITAL SCREENING BILAT W/ TOMO AND CAD
6 of 10 series · 6 of 30 positions shown · non-contrast
Comparison: Previous exam(s).

CLINICAL DATA: Screening.

EXAM:
DIGITAL SCREENING BILATERAL MAMMOGRAM WITH TOMOSYNTHESIS AND CAD
TECHNIQUE: Bilateral screening digital craniocaudal and mediolateral oblique
mammograms were obtained. Bilateral screening digital breast
tomosynthesis was performed. The images were evaluated with
computer-aided detection.

[L MLO synth-2D (1 of 2)]
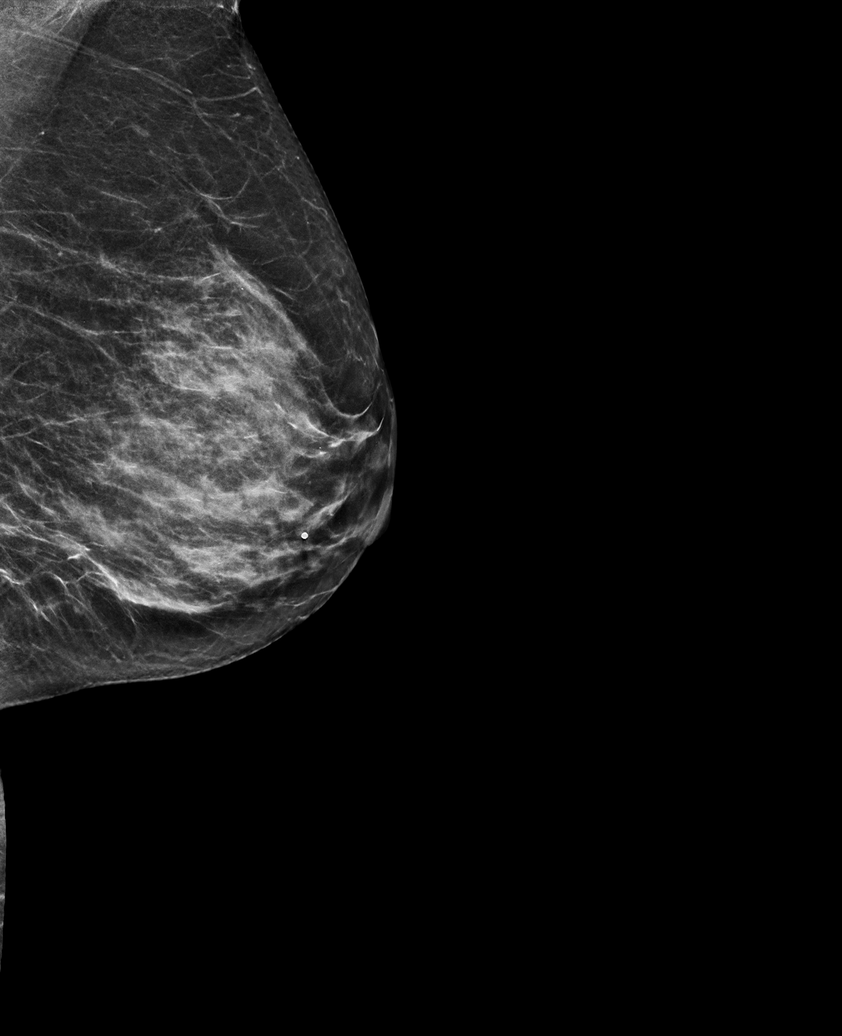

[L MLO synth-2D (2 of 2)]
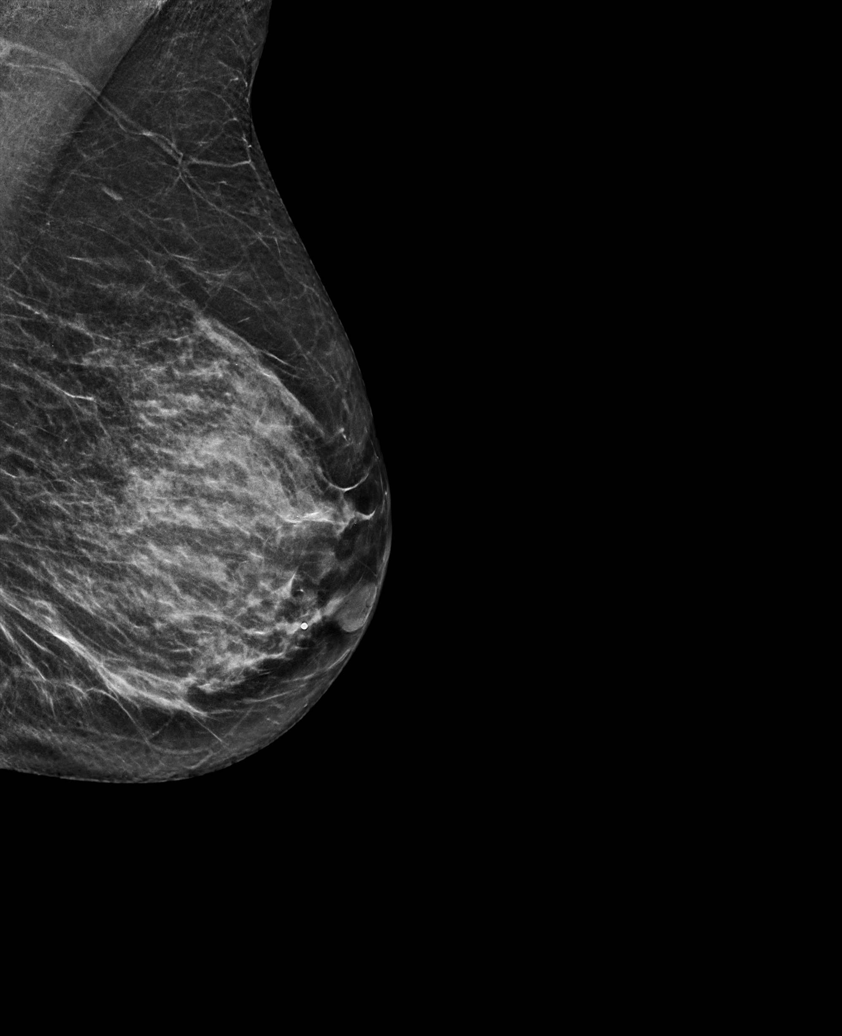

[L CC synth-2D]
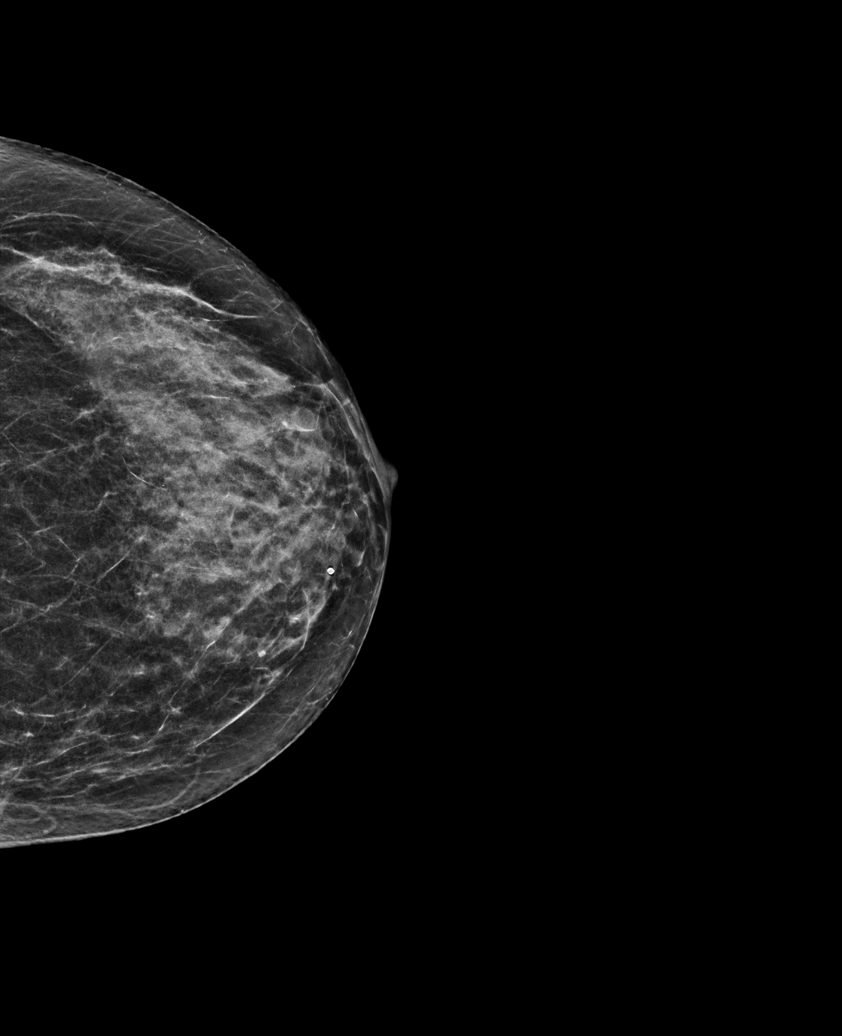

[R CC synth-2D]
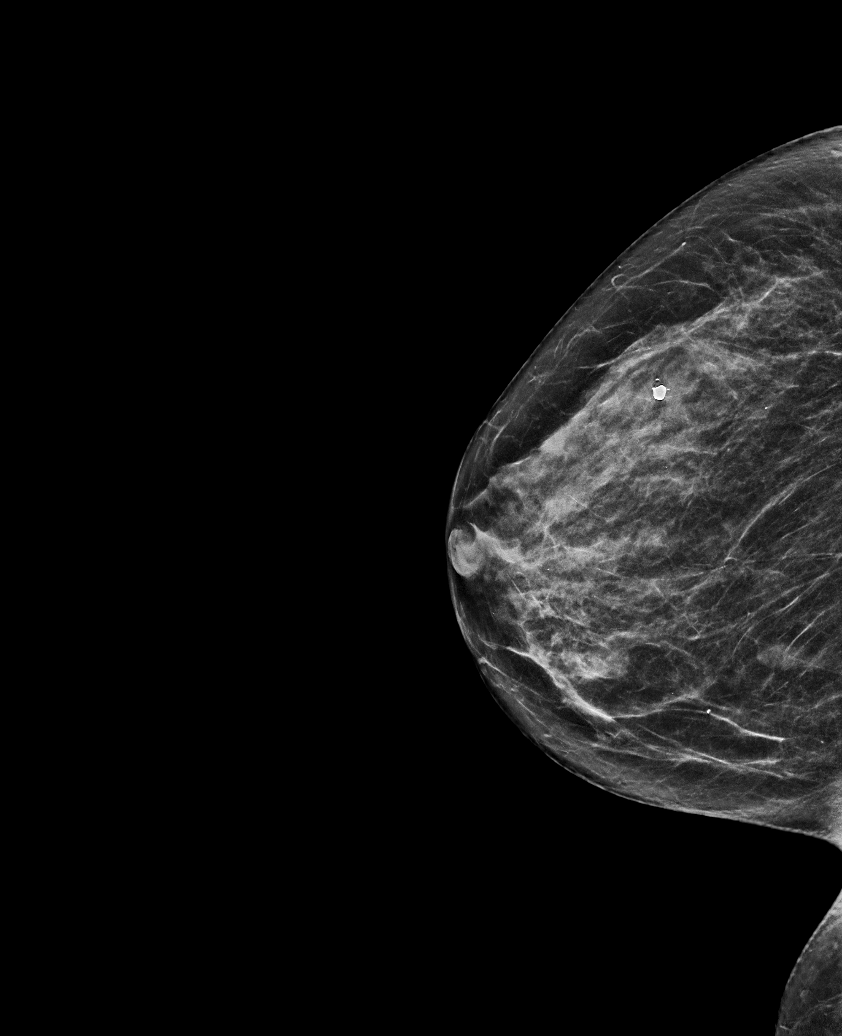

[R MLO synth-2D]
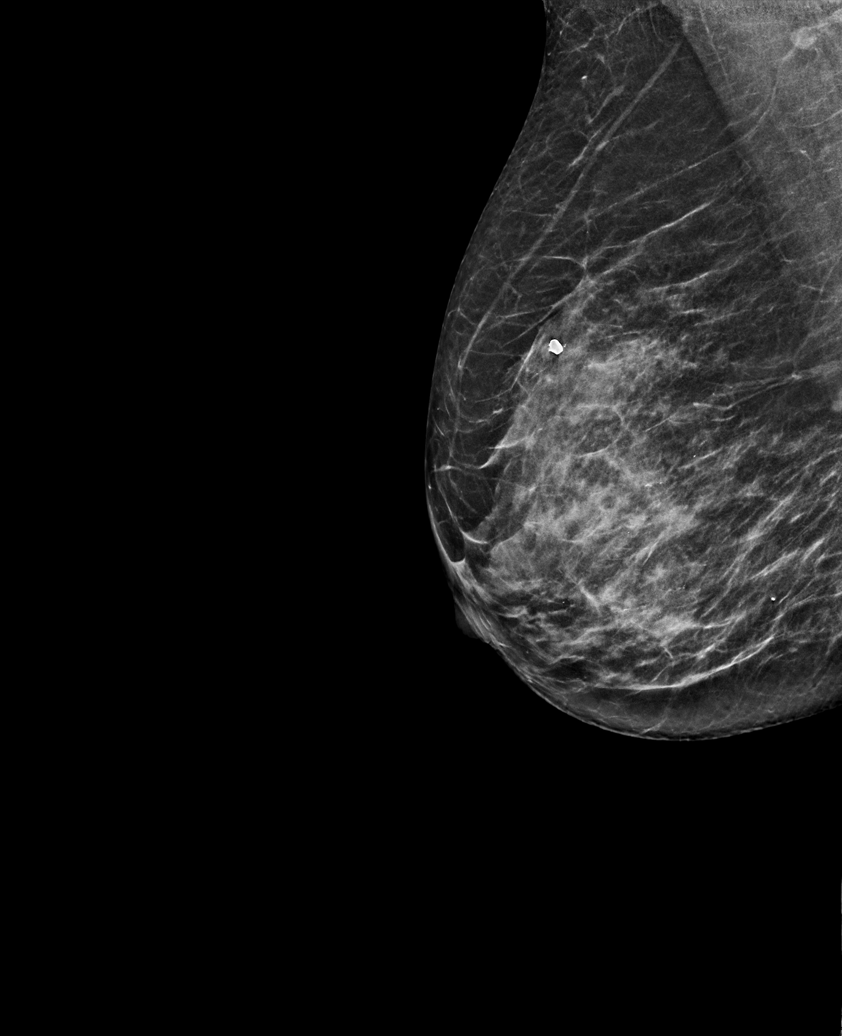

[R MLO tomo · tomo slice 28/55.0]
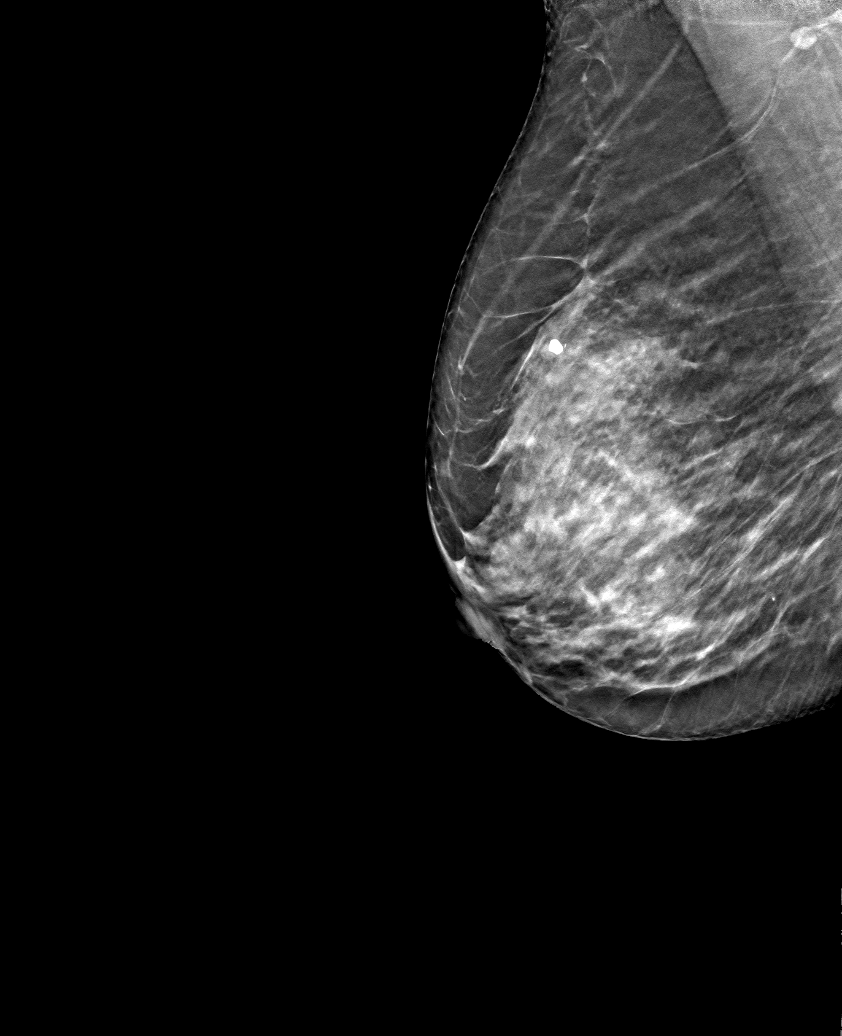

[6 of 30 positions shown; findings below may reference images not displayed]

ACR Breast Density Category c: The breast tissue is heterogeneously
dense, which may obscure small masses.
FINDINGS: There are no findings suspicious for malignancy.
IMPRESSION: No mammographic evidence of malignancy. A result letter of this
screening mammogram will be mailed directly to the patient.

RECOMMENDATION:
Screening mammogram in one year. (Code:Q3-W-BC3)

BI-RADS CATEGORY  1: Negative.

## 2022-10-20 ENCOUNTER — Ambulatory Visit (AMBULATORY_SURGERY_CENTER): Payer: Medicare Other

## 2022-10-20 VITALS — Ht 65.0 in | Wt 135.0 lb

## 2022-10-20 DIAGNOSIS — Z1211 Encounter for screening for malignant neoplasm of colon: Secondary | ICD-10-CM

## 2022-10-20 MED ORDER — NA SULFATE-K SULFATE-MG SULF 17.5-3.13-1.6 GM/177ML PO SOLN: 1.0000 | Freq: Once | ORAL | 0 refills | Status: AC

## 2022-10-20 NOTE — Progress Notes (Signed)
No egg or soy allergy known to patient  No issues known to pt with past sedation with any surgeries or procedures Patient denies ever being told they had issues or difficulty with intubation  No FH of Malignant Hyperthermia Pt is not on diet pills Pt is not on  home 02  Pt is not on blood thinners  Pt reports IBS with constipation  No A fib or A flutter Have any cardiac testing pending-- no  LOA: independent  Prep: suprep   Patient's chart reviewed by Cathlyn Parsons CNRA prior to previsit and patient appropriate for the [facility].  Previsit completed and red dot placed by patient's name on their procedure day (on provider's schedule).     PV competed with patient. Prep instructions sent via mychart and home address. Goodrx coupon for [facility] provided to use for price reduction if needed.

## 2022-11-02 ENCOUNTER — Other Ambulatory Visit: Payer: Self-pay | Admitting: Family Medicine

## 2022-11-02 DIAGNOSIS — Z1231 Encounter for screening mammogram for malignant neoplasm of breast: Secondary | ICD-10-CM

## 2022-11-09 ENCOUNTER — Encounter: Payer: Self-pay | Admitting: Internal Medicine

## 2022-11-14 ENCOUNTER — Encounter: Payer: Self-pay | Admitting: Certified Registered Nurse Anesthetist

## 2022-11-19 NOTE — Progress Notes (Signed)
Gastroenterology History and Physical   Primary Care Physician:  Myrlene Broker, MD   Reason for Procedure:   Hx colon adenoma  Plan:    colonoscopy     HPI: Angela Robbins is a 72 y.o. female s/p removal diminutive colon adenoma 2009 and distal hyperplastic polyp 2014. In 2014 she did have anal stenosis + healing fissure and retained air in recovery.   Past Medical History:  Diagnosis Date   Allergy    Anal fissure 09/27/2012   Hyperlipidemia    IBS (irritable bowel syndrome)    Tubular adenoma 08/26/2007    Past Surgical History:  Procedure Laterality Date   BREAST BIOPSY Right 2004   benign, fibroadenoma   BREAST CYST ASPIRATION Right    COLONOSCOPY  08/26/2007; 2014    Prior to Admission medications   Medication Sig Start Date End Date Taking? Authorizing Provider  Astaxanthin 4 MG CAPS Take by mouth. Take one by mouth daily.    [provider]  Cholecalciferol (VITAMIN D3) 1000 UNITS CAPS Take 1 capsule by mouth daily.    [provider]  Cyanocobalamin (VITAMIN B 12) 500 MCG TABS Take 2 tablets by mouth daily.    [provider]  Docusate Sodium (STOOL SOFTENER LAXATIVE PO) Take by mouth.    [provider]  magnesium 30 MG tablet Take 30 mg by mouth once.    [provider]  NON FORMULARY 1 tablet. kyolic acid and garlic Patient not taking: Reported on 10/20/2022    [provider]  Plant Sterols and Stanols (CHOLEST OFF PO) Take 2 tablets by mouth in the morning and at bedtime. Patient not taking: Reported on 10/20/2022    [provider]  polycarbophil (FIBERCON) 625 MG tablet Take by mouth daily. 3 caps per day     [provider]  Probiotic Product (ALIGN) 4 MG CAPS Take 1 capsule by mouth daily. Patient not taking: Reported on 10/20/2022 04/08/11   Stacie Glaze, MD  vitamin C (ASCORBIC ACID) 500 MG tablet Take 500 mg by mouth daily.    [provider]  Azelastine HCl  (ASTEPRO) 0.15 % SOLN Place 1 spray into the nose 2 (two) times daily. 03/11/11 04/08/11  Stacie Glaze, MD    Current Outpatient Medications  Medication Sig Dispense Refill   Astaxanthin 4 MG CAPS Take by mouth. Take one by mouth daily.     Cholecalciferol (VITAMIN D3) 1000 UNITS CAPS Take 1 capsule by mouth daily.     Cyanocobalamin (VITAMIN B 12) 500 MCG TABS Take 2 tablets by mouth daily.     magnesium 30 MG tablet Take 30 mg by mouth once.     vitamin C (ASCORBIC ACID) 500 MG tablet Take 500 mg by mouth daily.     Docusate Sodium (STOOL SOFTENER LAXATIVE PO) Take by mouth.     NON FORMULARY 1 tablet. kyolic acid and garlic (Patient not taking: Reported on 10/20/2022)     Plant Sterols and Stanols (CHOLEST OFF PO) Take 2 tablets by mouth in the morning and at bedtime. (Patient not taking: Reported on 10/20/2022)     polycarbophil (FIBERCON) 625 MG tablet Take by mouth daily. 3 caps per day  (Patient not taking: Reported on 11/20/2022)     Probiotic Product (ALIGN) 4 MG CAPS Take 1 capsule by mouth daily. (Patient not taking: Reported on 10/20/2022)     Current Facility-Administered Medications  Medication Dose Route Frequency Provider Last Rate Last Admin  0.9 %  sodium chloride infusion  500 mL Intravenous Continuous Iva Boop, MD        Allergies as of 11/20/2022 - Review Complete 11/20/2022  Allergen Reaction Noted   Prednisone Other (See Comments) 09/27/2012    Family History  Problem Relation Age of Onset   Hypertension Mother    Cancer Mother    Heart disease Father    Diabetes Father    Colon cancer Neg Hx    Breast cancer Neg Hx    Colon polyps Neg Hx    Rectal cancer Neg Hx    Stomach cancer Neg Hx     Social History   Socioeconomic History   Marital status: Married    Spouse name: Not on file   Number of children: Not on file   Years of education: Not on file   Highest education level: Not on file  Occupational History   Occupation: retired  Tobacco  Use   Smoking status: Never   Smokeless tobacco: Never  Vaping Use   Vaping status: Never Used  Substance and Sexual Activity   Alcohol use: No   Drug use: No   Sexual activity: Yes  Other Topics Concern   Not on file  Social History Narrative   Married   Retired   1 son and 1 daughter   Social Determinants of Corporate investment banker Strain: Not on file  Food Insecurity: Not on file  Transportation Needs: Not on file  Physical Activity: Not on file  Stress: Not on file  Social Connections: Not on file  Intimate Partner Violence: Not on file    Review of Systems:  All other review of systems negative except as mentioned in the HPI.  Physical Exam: Vital signs BP (!) 129/92   Pulse 61   Temp 98.2 F (36.8 C)   Resp 11   Ht 5\' 5"  (1.651 m)   Wt 135 lb (61.2 kg)   SpO2 99%   BMI 22.47 kg/m   General:   Alert,  Well-developed, well-nourished, pleasant and cooperative in NAD Lungs:  Clear throughout to auscultation.   Heart:  Regular rate and rhythm; no murmurs, clicks, rubs,  or gallops. Abdomen:  Soft, nontender and nondistended. Normal bowel sounds.   Neuro/Psych:  Alert and cooperative. Normal mood and affect. A and O x 3   @Idalee Foxworthy  Sena Slate, MD, [facility] [phone] (pager) 11/20/2022 10:36 AM@

## 2022-11-20 ENCOUNTER — Ambulatory Visit (AMBULATORY_SURGERY_CENTER): Payer: Medicare Other | Admitting: Internal Medicine

## 2022-11-20 ENCOUNTER — Encounter: Payer: Self-pay | Admitting: Internal Medicine

## 2022-11-20 VITALS — BP 106/60 | HR 55 | Temp 98.2°F | Resp 10 | Ht 65.0 in | Wt 135.0 lb

## 2022-11-20 DIAGNOSIS — Z1211 Encounter for screening for malignant neoplasm of colon: Secondary | ICD-10-CM

## 2022-11-20 DIAGNOSIS — D125 Benign neoplasm of sigmoid colon: Secondary | ICD-10-CM

## 2022-11-20 MED ORDER — SODIUM CHLORIDE 0.9 % IV SOLN: 500.0000 mL | INTRAVENOUS | Status: DC

## 2022-11-20 NOTE — Progress Notes (Signed)
Called to room to assist during endoscopic procedure.  Patient ID and intended procedure confirmed with present staff. Received instructions for my participation in the procedure from the performing physician.  

## 2022-11-20 NOTE — Patient Instructions (Addendum)
There were 2 tiny polyps seen and removed.  I will let you know pathology results  by mail and/or My Chart. Based upon their appearance, number and age I do not anticipate recommending another routine colonoscopy.  I appreciate the opportunity to care for you. Iva Boop, MD, [facility]   Discharge instructions given. Handout on polyps. Resume previous medications. YOU HAD AN ENDOSCOPIC PROCEDURE TODAY AT THE [facility]:   Refer to the procedure report that was given to you for any specific questions about what was found during the examination.  If the procedure report does not answer your questions, please call your gastroenterologist to clarify.  If you requested that your care partner not be given the details of your procedure findings, then the procedure report has been included in a sealed envelope for you to review at your convenience later.  YOU SHOULD EXPECT: Some feelings of bloating in the abdomen. Passage of more gas than usual.  Walking can help get rid of the air that was put into your GI tract during the procedure and reduce the bloating. If you had a lower endoscopy (such as a colonoscopy or flexible sigmoidoscopy) you may notice spotting of blood in your stool or on the toilet paper. If you underwent a bowel prep for your procedure, you may not have a normal bowel movement for a few days.  Please Note:  You might notice some irritation and congestion in your nose or some drainage.  This is from the oxygen used during your procedure.  There is no need for concern and it should clear up in a day or so.  SYMPTOMS TO REPORT IMMEDIATELY:  Following lower endoscopy (colonoscopy or flexible sigmoidoscopy):  Excessive amounts of blood in the stool  Significant tenderness or worsening of abdominal pains  Swelling of the abdomen that is new, acute  Fever of 100F or higher   For urgent or emergent issues, a gastroenterologist can be reached at any hour by calling [phone]  [phone]. Do not use MyChart messaging for urgent concerns.    DIET:  We do recommend a small meal at first, but then you may proceed to your regular diet.  Drink plenty of fluids but you should avoid alcoholic beverages for 24 hours.  ACTIVITY:  You should plan to take it easy for the rest of today and you should NOT DRIVE or use heavy machinery until tomorrow (because of the sedation medicines used during the test).    FOLLOW UP: Our staff will call the number listed on your records the next business day following your procedure.  We will call around 7:15- 8:00 am to check on you and address any questions or concerns that you may have regarding the information given to you following your procedure. If we do not reach you, we will leave a message.     If any biopsies were taken you will be contacted by phone or by letter within the next 1-3 weeks.  Please call us at [phone] if you have not heard about the biopsies in 3 weeks.    SIGNATURES/CONFIDENTIALITY: You and/or your care partner have signed paperwork which will be entered into your electronic medical record.  These signatures attest to the fact that that the information above on your After Visit Summary has been reviewed and is understood.  Full responsibility of the confidentiality of this discharge information lies with you and/or your care-partner.

## 2022-11-20 NOTE — Op Note (Signed)
[facility] Patient Name: Angela Robbins Procedure Date: 11/20/2022 10:32 AM MRN: [id number] Endoscopist: Iva Boop , MD, [id number] Age: 72 Referring MD:  Date of Birth: 1950-07-10 Gender: Female Account #: [id number] Procedure:                Colonoscopy Indications:              Screening for colorectal malignant neoplasm, Last                            colonoscopy: 2014 Medicines:                Monitored Anesthesia Care Procedure:                Pre-Anesthesia Assessment:                           - Prior to the procedure, a History and Physical                            was performed, and patient medications and                            allergies were reviewed. The patient's tolerance of                            previous anesthesia was also reviewed. The risks                            and benefits of the procedure and the sedation                            options and risks were discussed with the patient.                            All questions were answered, and informed consent                            was obtained. Prior Anticoagulants: The patient has                            taken no anticoagulant or antiplatelet agents. ASA                            Grade Assessment: I - A normal, healthy patient.                            After reviewing the risks and benefits, the patient                            was deemed in satisfactory condition to undergo the                            procedure.  After obtaining informed consent, the colonoscope                            was passed under direct vision. Throughout the                            procedure, the patient's blood pressure, pulse, and                            oxygen saturations were monitored continuously. The                            Olympus Scope SN [id number] was introduced through the                            anus and advanced to the the cecum, identified by                             appendiceal orifice and ileocecal valve. The                            colonoscopy was performed without difficulty. The                            patient tolerated the procedure well. The quality                            of the bowel preparation was good. The ileocecal                            valve, appendiceal orifice, and rectum were                            photographed. The bowel preparation used was SUPREP                            via split dose instruction. Scope In: 10:45:09 AM Scope Out: 11:04:07 AM Scope Withdrawal Time: 0 hours 13 minutes 55 seconds  Total Procedure Duration: 0 hours 18 minutes 58 seconds  Findings:                 The perianal and digital rectal examinations were                            normal.                           Two sessile polyps were found in the sigmoid colon.                            The polyps were diminutive in size. These polyps                            were removed with a cold snare. Resection and  retrieval were complete. Verification of patient                            identification for the specimen was done. Estimated                            blood loss was minimal.                           The exam was otherwise without abnormality on                            direct and retroflexion views. Complications:            No immediate complications. Estimated Blood Loss:     Estimated blood loss was minimal. Impression:               - Two diminutive polyps in the sigmoid colon,                            removed with a cold snare. Resected and retrieved.                           - The examination was otherwise normal on direct                            and retroflexion views except for some fleshy anal                            tags/old hemorrhoids Recommendation:           - Patient has a contact number available for                            emergencies. The  signs and symptoms of potential                            delayed complications were discussed with the                            patient. Return to normal activities tomorrow.                            Written discharge instructions were provided to the                            patient.                           - Resume previous diet.                           - Continue present medications.                           - Await pathology results.                           -  No recommendation at this time regarding repeat                            colonoscopy due to age. Iva Boop, MD 11/20/2022 11:10:25 AM This report has been signed electronically.

## 2022-11-20 NOTE — Progress Notes (Signed)
Pt's states no medical or surgical changes since previsit or office visit. 

## 2022-11-20 NOTE — Progress Notes (Signed)
Report given to PACU, vss 

## 2022-11-23 ENCOUNTER — Telehealth: Payer: Self-pay | Admitting: *Deleted

## 2022-11-23 NOTE — Telephone Encounter (Signed)
  Follow up Call-     11/20/2022   10:01 AM  Call back number  Post procedure Call Back phone  # [phone]  Permission to leave phone message Yes     Patient questions:  Do you have a fever, pain , or abdominal swelling? No. Pain Score  0 *  Have you tolerated food without any problems? Yes.    Have you been able to return to your normal activities? Yes.    Do you have any questions about your discharge instructions: Diet   No. Medications  No. Follow up visit  No.  Do you have questions or concerns about your Care? No.  Actions: * If pain score is 4 or above: No action needed, pain <4.

## 2022-11-24 LAB — SURGICAL PATHOLOGY

## 2022-11-26 ENCOUNTER — Encounter: Payer: Self-pay | Admitting: Internal Medicine

## 2022-12-08 ENCOUNTER — Ambulatory Visit
Admission: RE | Admit: 2022-12-08 | Discharge: 2022-12-08 | Disposition: A | Discharge status: Home / Self Care | Payer: Medicare Other | Source: Ambulatory Visit | Attending: Family Medicine | Admitting: Family Medicine

## 2022-12-08 DIAGNOSIS — Z1231 Encounter for screening mammogram for malignant neoplasm of breast: Secondary | ICD-10-CM | POA: Diagnosis present

## 2023-08-09 ENCOUNTER — Encounter: Admitting: Internal Medicine

## 2023-08-09 ENCOUNTER — Ambulatory Visit

## 2023-08-24 ENCOUNTER — Encounter: Payer: Self-pay | Admitting: Internal Medicine

## 2023-08-24 ENCOUNTER — Ambulatory Visit: Admitting: Internal Medicine

## 2023-08-24 VITALS — BP 112/82 | HR 63 | Temp 97.7°F | Ht 65.0 in | Wt 132.0 lb

## 2023-08-24 DIAGNOSIS — K582 Mixed irritable bowel syndrome: Secondary | ICD-10-CM

## 2023-08-24 DIAGNOSIS — Z0000 Encounter for general adult medical examination without abnormal findings: Secondary | ICD-10-CM | POA: Diagnosis not present

## 2023-08-24 DIAGNOSIS — E785 Hyperlipidemia, unspecified: Secondary | ICD-10-CM

## 2023-08-24 LAB — COMPREHENSIVE METABOLIC PANEL WITH GFR
ALT: 13 U/L (ref 0–35)
AST: 22 U/L (ref 0–37)
Albumin: 4.2 g/dL (ref 3.5–5.2)
Alkaline Phosphatase: 46 U/L (ref 39–117)
BUN: 14 mg/dL (ref 6–23)
CO2: 30 meq/L (ref 19–32)
Calcium: 9.3 mg/dL (ref 8.4–10.5)
Chloride: 102 meq/L (ref 96–112)
Creatinine, Ser: 0.82 mg/dL (ref 0.40–1.20)
GFR: 70.82 mL/min (ref >60.00)
Glucose, Bld: 85 mg/dL (ref 70–99)
Potassium: 4.3 meq/L (ref 3.5–5.1)
Sodium: 138 meq/L (ref 135–145)
Total Bilirubin: 0.5 mg/dL (ref 0.2–1.2)
Total Protein: 7.1 g/dL (ref 6.0–8.3)

## 2023-08-24 LAB — CBC
HCT: 35.2 % — ABNORMAL LOW (ref 36.0–46.0)
Hemoglobin: 12.2 g/dL (ref 12.0–15.0)
MCHC: 34.6 g/dL (ref 30.0–36.0)
MCV: 88.8 fl (ref 78.0–100.0)
Platelets: 213 K/uL (ref 150.0–400.0)
RBC: 3.97 Mil/uL (ref 3.87–5.11)
RDW: 13.6 % (ref 11.5–15.5)
WBC: 3.6 K/uL — ABNORMAL LOW (ref 4.0–10.5)

## 2023-08-24 LAB — LIPID PANEL
Cholesterol: 253 mg/dL — ABNORMAL HIGH (ref 0–200)
HDL: 87.6 mg/dL (ref >39.00)
LDL Cholesterol: 154 mg/dL — ABNORMAL HIGH (ref 0–99)
NonHDL: 165.46
Total CHOL/HDL Ratio: 3
Triglycerides: 55 mg/dL (ref 0.0–149.0)
VLDL: 11 mg/dL (ref 0.0–40.0)

## 2023-08-24 LAB — VITAMIN D 25 HYDROXY (VIT D DEFICIENCY, FRACTURES): VITD: 64.82 ng/mL (ref 30.00–100.00)

## 2023-08-24 NOTE — Assessment & Plan Note (Signed)
 We discussed her CV risk (see note) and cholesterol levels. She is considering statin and we will check lipid panel today and adjust as needed.

## 2023-08-24 NOTE — Assessment & Plan Note (Signed)
 Overall slightly worse than last year after colonoscopy. Continue to use dietary strategies.

## 2023-08-24 NOTE — Assessment & Plan Note (Signed)
 Flu shot yearly. Pneumonia complete. Shingrix complete. Tetanus due 2027. Colonoscopy aged out future. Mammogram due 2026, pap smear aged out and dexa complete. Counseled about sun safety and mole surveillance. Counseled about the dangers of distracted driving. Given 10 year screening recommendations.

## 2023-08-24 NOTE — Progress Notes (Signed)
 Subjective:   Patient ID: Angela Robbins, female    DOB: 08-15-1950, 73 y.o.   MRN: [id number]  The patient is here for physical. Pertinent topics discussed: Discussed the use of AI scribe software for clinical note transcription with the patient, who gave verbal consent to proceed.  History of Present Illness Her IBS symptoms have been more severe since a colonoscopy in November. She experiences abdominal pain upon waking and difficulty with bowel movements. She attempts to manage her symptoms with dietary adjustments, but finds that too much fiber worsens her condition. She describes a delicate balance in her diet to avoid exacerbating her symptoms.  Here for medicare wellness and physical, no new complaints. Please see A/P for status and treatment of chronic medical problems.   Diet: heart healthy Physical activity: bikes 4 miles most days Depression/mood screen: negative Hearing: intact to whispered voice Visual acuity: grossly normal, performs annual eye exam  ADLs: capable Fall risk: none Home safety: good Cognitive evaluation: intact to orientation, naming, recall and repetition EOL planning: adv directives discussed, in place  [employer] Visit from 08/24/2023 in [facility] [place]  PHQ-2 Total Score 0    Flowsheet Row Office Visit from 08/07/2022 in [facility]  PHQ-9 Total Score 0      07/17/2020    9:00 AM 07/24/2021   10:09 AM 08/07/2022    9:52 AM 08/23/2023    3:00 PM 08/24/2023    8:03 AM  Fall Risk  Falls in the past year? 0 0 0 0 0  Was there an injury with Fall? 0 0 0  0  Fall Risk Category Calculator 0 0 0  0  Fall Risk Category (Retired) Low  Low      Fall risk Follow up   Falls evaluation completed  Falls evaluation completed     Data saved with a previous flowsheet row definition  The 10-year ASCVD risk score (Arnett DK, et al., 2019) is: 10.5%   Values used to calculate the score:      Age: 51 years     Clincally relevant sex: Female     Is Non-Hispanic African American: No     Diabetic: No     Tobacco smoker: No     Systolic Blood Pressure: 112 mmHg     Is BP treated: No     HDL Cholesterol: 95 mg/dL     Total Cholesterol: 288 mg/dL    I have personally reviewed and have noted 1. The patient's medical and social history - reviewed today no changes 2. Their use of alcohol, tobacco or illicit drugs 3. Their current medications and supplements 4. The patient's functional ability including ADL's, fall risks, home safety risks and hearing or visual impairment. 5. Diet and physical activities 6. Evidence for depression or mood disorders 7. Care team reviewed and updated 8.  The patient is not on an opioid pain medication.  Patient Care Team: Rollene Almarie LABOR, MD as PCP - General (Internal Medicine) Avram Lupita BRAVO, MD as Consulting Physician (Gastroenterology) Past Medical History:  Diagnosis Date   Allergy    Anal fissure 09/27/2012   Anxiety    Cataract no surgery yet   Heart murmur    Hyperlipidemia    IBS (irritable bowel syndrome)    Tubular adenoma 08/26/2007   Past Surgical History:  Procedure Laterality Date   BREAST BIOPSY Right 2004   benign, fibroadenoma   BREAST CYST  ASPIRATION Right    COLON SURGERY  Colonoscopy-11/20/22   2 polyps   COLONOSCOPY  08/26/2007; 2014   TUBAL LIGATION     Family History  Problem Relation Age of Onset   Hypertension Mother    Cancer Mother    Heart disease Father    Diabetes Father    ADD / ADHD Son    Colon cancer Neg Hx    Breast cancer Neg Hx    Colon polyps Neg Hx    Rectal cancer Neg Hx    Stomach cancer Neg Hx    EKG: Rate 49, axis normal, interval normal, sinus brady, no st or t wave changes, no significant change compared to prior 2012   Review of Systems  Constitutional: Negative.   HENT: Negative.    Eyes: Negative.   Respiratory:  Negative for cough, chest tightness and shortness of  breath.   Cardiovascular:  Negative for chest pain, palpitations and leg swelling.  Gastrointestinal:  Positive for abdominal pain and constipation. Negative for abdominal distention, diarrhea, nausea and vomiting.  Musculoskeletal: Negative.   Skin: Negative.   Neurological: Negative.   Psychiatric/Behavioral: Negative.      Objective:  Physical Exam Constitutional:      Appearance: She is well-developed.  HENT:     Head: Normocephalic and atraumatic.  Cardiovascular:     Rate and Rhythm: Normal rate and regular rhythm.  Pulmonary:     Effort: Pulmonary effort is normal. No respiratory distress.     Breath sounds: Normal breath sounds. No wheezing or rales.  Abdominal:     General: Bowel sounds are normal. There is no distension.     Palpations: Abdomen is soft.     Tenderness: There is no abdominal tenderness. There is no rebound.  Musculoskeletal:     Cervical back: Normal range of motion.  Skin:    General: Skin is warm and dry.  Neurological:     Mental Status: She is alert and oriented to person, place, and time.     Coordination: Coordination normal.     Vitals:   08/24/23 0759  BP: 112/82  Pulse: 63  Temp: 97.7 F (36.5 C)  TempSrc: Oral  SpO2: 98%  Weight: 132 lb (59.9 kg)  Height: 5' 5 (1.651 m)    Assessment & Plan:

## 2023-08-25 ENCOUNTER — Ambulatory Visit

## 2023-08-25 ENCOUNTER — Ambulatory Visit: Payer: Self-pay | Admitting: Internal Medicine

## 2023-08-26 NOTE — Progress Notes (Signed)
 Patient has agreed to take pravastatin  medication please call in to [facility] on [address]

## 2023-08-27 ENCOUNTER — Other Ambulatory Visit: Payer: Self-pay | Admitting: Internal Medicine

## 2023-08-27 ENCOUNTER — Ambulatory Visit

## 2023-08-27 MED ORDER — PRAVASTATIN SODIUM 20 MG PO TABS: 20.0000 mg | ORAL_TABLET | Freq: Every day | ORAL | 3 refills | Status: AC

## 2023-10-26 ENCOUNTER — Telehealth: Payer: Self-pay

## 2023-10-26 NOTE — Telephone Encounter (Signed)
 Copied from CRM #[id number]. Topic: Clinical - Medical Advice >> Oct 26, 2023  2:09 PM Angela Robbins wrote: Reason for CRM: Patient called in wanting to know should she take her pravastatin  (PRAVACHOL ) 20 MG tablet due to having issues with dry skin/peeling and itching today. Please call [phone] & would like to make note that's she had her flu shot as of today 10/26/23

## 2023-10-26 NOTE — Telephone Encounter (Signed)
 Please advise as Md is out of office

## 2023-10-27 NOTE — Telephone Encounter (Signed)
 Called patient and informed  her to stop taking the pravastatin  and to schedule a follow up appointment with Dr Rollene

## 2023-10-27 NOTE — Telephone Encounter (Signed)
 If Ms. Jeoffrey thinks that the symptoms are related to pravastatin  -stop pravastatin  and make an appointment to see Dr. Rollene. Thank you

## 2023-11-03 ENCOUNTER — Other Ambulatory Visit: Payer: Self-pay | Admitting: Internal Medicine

## 2023-11-03 DIAGNOSIS — Z1231 Encounter for screening mammogram for malignant neoplasm of breast: Secondary | ICD-10-CM

## 2023-12-09 ENCOUNTER — Ambulatory Visit
Admission: RE | Admit: 2023-12-09 | Discharge: 2023-12-09 | Disposition: A | Discharge status: Home / Self Care | Source: Ambulatory Visit | Attending: Internal Medicine | Admitting: Internal Medicine

## 2023-12-09 DIAGNOSIS — Z1231 Encounter for screening mammogram for malignant neoplasm of breast: Secondary | ICD-10-CM | POA: Diagnosis present

## 2023-12-14 ENCOUNTER — Ambulatory Visit: Payer: Self-pay | Admitting: Internal Medicine
